# Patient Record
Sex: Male | Born: 1955 | Race: White | Hispanic: No | Smoking: Never smoker
Health system: Southern US, Community
[De-identification: ages and names within clinical notes are randomized; demographics above are authoritative.]

## PROBLEM LIST (undated history)

## (undated) DIAGNOSIS — Z95820 Peripheral vascular angioplasty status with implants and grafts: Secondary | ICD-10-CM

## (undated) DIAGNOSIS — M1990 Unspecified osteoarthritis, unspecified site: Secondary | ICD-10-CM

## (undated) DIAGNOSIS — J45909 Unspecified asthma, uncomplicated: Secondary | ICD-10-CM

## (undated) DIAGNOSIS — K219 Gastro-esophageal reflux disease without esophagitis: Secondary | ICD-10-CM

## (undated) DIAGNOSIS — C801 Malignant (primary) neoplasm, unspecified: Secondary | ICD-10-CM

## (undated) DIAGNOSIS — I209 Angina pectoris, unspecified: Secondary | ICD-10-CM

## (undated) DIAGNOSIS — J40 Bronchitis, not specified as acute or chronic: Secondary | ICD-10-CM

## (undated) DIAGNOSIS — E785 Hyperlipidemia, unspecified: Secondary | ICD-10-CM

## (undated) DIAGNOSIS — E119 Type 2 diabetes mellitus without complications: Secondary | ICD-10-CM

## (undated) DIAGNOSIS — I10 Essential (primary) hypertension: Secondary | ICD-10-CM

## (undated) DIAGNOSIS — R9439 Abnormal result of other cardiovascular function study: Secondary | ICD-10-CM

## (undated) DIAGNOSIS — K7460 Unspecified cirrhosis of liver: Secondary | ICD-10-CM

## (undated) DIAGNOSIS — Z006 Encounter for examination for normal comparison and control in clinical research program: Secondary | ICD-10-CM

## (undated) DIAGNOSIS — B1920 Unspecified viral hepatitis C without hepatic coma: Secondary | ICD-10-CM

## (undated) DIAGNOSIS — I2510 Atherosclerotic heart disease of native coronary artery without angina pectoris: Secondary | ICD-10-CM

## (undated) HISTORY — PX: TONSILLECTOMY: SUR1361

## (undated) HISTORY — PX: ROTATOR CUFF REPAIR: SHX139

## (undated) HISTORY — PX: JOINT REPLACEMENT: SHX530

## (undated) HISTORY — PX: ACROMIO-CLAVICULAR JOINT REPAIR: SHX5183

## (undated) HISTORY — PX: CHOLECYSTECTOMY: SHX55

## (undated) HISTORY — PX: COLONOSCOPY: SHX174

## (undated) HISTORY — DX: Type 2 diabetes mellitus without complications: E11.9

## (undated) HISTORY — PX: KNEE SURGERY: SHX244

## (undated) HISTORY — DX: Unspecified viral hepatitis C without hepatic coma: B19.20

## (undated) HISTORY — DX: Hyperlipidemia, unspecified: E78.5

## (undated) HISTORY — DX: Unspecified cirrhosis of liver: K74.60

---

## 2001-08-01 ENCOUNTER — Ambulatory Visit (HOSPITAL_COMMUNITY): Admission: RE | Admit: 2001-08-01 | Discharge: 2001-08-01 | Payer: Self-pay | Admitting: *Deleted

## 2001-10-31 ENCOUNTER — Encounter: Admission: RE | Admit: 2001-10-31 | Discharge: 2001-10-31 | Payer: Self-pay | Admitting: *Deleted

## 2002-02-08 ENCOUNTER — Encounter: Admission: RE | Admit: 2002-02-08 | Discharge: 2002-02-08 | Payer: Self-pay | Admitting: *Deleted

## 2002-07-10 ENCOUNTER — Encounter: Admission: RE | Admit: 2002-07-10 | Discharge: 2002-07-10 | Payer: Self-pay | Admitting: Psychiatry

## 2002-10-08 ENCOUNTER — Encounter: Admission: RE | Admit: 2002-10-08 | Discharge: 2002-10-08 | Payer: Self-pay | Admitting: Psychiatry

## 2003-02-01 ENCOUNTER — Encounter: Admission: RE | Admit: 2003-02-01 | Discharge: 2003-02-01 | Payer: Self-pay | Admitting: *Deleted

## 2003-03-04 ENCOUNTER — Encounter: Admission: RE | Admit: 2003-03-04 | Discharge: 2003-03-04 | Payer: Self-pay | Admitting: *Deleted

## 2003-05-06 ENCOUNTER — Encounter: Admission: RE | Admit: 2003-05-06 | Discharge: 2003-05-06 | Payer: Self-pay | Admitting: *Deleted

## 2003-05-19 ENCOUNTER — Encounter: Payer: Self-pay | Admitting: Emergency Medicine

## 2003-05-19 ENCOUNTER — Inpatient Hospital Stay (HOSPITAL_COMMUNITY): Admission: EM | Admit: 2003-05-19 | Discharge: 2003-05-21 | Payer: Self-pay | Admitting: Emergency Medicine

## 2003-08-30 ENCOUNTER — Encounter: Payer: Self-pay | Admitting: Internal Medicine

## 2003-08-30 ENCOUNTER — Encounter: Admission: RE | Admit: 2003-08-30 | Discharge: 2003-08-30 | Payer: Self-pay | Admitting: Internal Medicine

## 2003-09-05 ENCOUNTER — Encounter: Admission: RE | Admit: 2003-09-05 | Discharge: 2003-09-05 | Payer: Self-pay | Admitting: Internal Medicine

## 2003-09-05 ENCOUNTER — Encounter: Payer: Self-pay | Admitting: Internal Medicine

## 2003-11-19 ENCOUNTER — Emergency Department (HOSPITAL_COMMUNITY): Admission: AD | Admit: 2003-11-19 | Discharge: 2003-11-19 | Payer: Self-pay | Admitting: Family Medicine

## 2003-11-28 ENCOUNTER — Encounter (INDEPENDENT_AMBULATORY_CARE_PROVIDER_SITE_OTHER): Payer: Self-pay | Admitting: *Deleted

## 2003-11-28 ENCOUNTER — Ambulatory Visit (HOSPITAL_COMMUNITY): Admission: RE | Admit: 2003-11-28 | Discharge: 2003-11-29 | Payer: Self-pay | Admitting: Surgery

## 2003-12-21 HISTORY — PX: CARDIAC CATHETERIZATION: SHX172

## 2004-01-20 ENCOUNTER — Encounter: Admission: RE | Admit: 2004-01-20 | Discharge: 2004-01-20 | Payer: Self-pay | Admitting: Psychiatry

## 2005-01-20 ENCOUNTER — Ambulatory Visit (HOSPITAL_COMMUNITY): Payer: Self-pay | Admitting: Psychiatry

## 2005-07-20 ENCOUNTER — Ambulatory Visit: Payer: Self-pay | Admitting: Internal Medicine

## 2005-10-13 ENCOUNTER — Ambulatory Visit (HOSPITAL_COMMUNITY): Payer: Self-pay | Admitting: Psychiatry

## 2006-02-16 ENCOUNTER — Ambulatory Visit (HOSPITAL_COMMUNITY): Payer: Self-pay | Admitting: Psychiatry

## 2006-05-23 ENCOUNTER — Ambulatory Visit (HOSPITAL_COMMUNITY): Payer: Self-pay | Admitting: Psychiatry

## 2006-09-21 ENCOUNTER — Ambulatory Visit (HOSPITAL_COMMUNITY): Payer: Self-pay | Admitting: Psychiatry

## 2007-01-23 ENCOUNTER — Ambulatory Visit (HOSPITAL_COMMUNITY): Payer: Self-pay | Admitting: Psychiatry

## 2007-06-05 ENCOUNTER — Ambulatory Visit (HOSPITAL_COMMUNITY): Admission: RE | Admit: 2007-06-05 | Discharge: 2007-06-05 | Payer: Self-pay | Admitting: Orthopedic Surgery

## 2007-06-21 ENCOUNTER — Ambulatory Visit (HOSPITAL_COMMUNITY): Payer: Self-pay | Admitting: Psychiatry

## 2007-10-16 ENCOUNTER — Ambulatory Visit (HOSPITAL_COMMUNITY): Payer: Self-pay | Admitting: Psychiatry

## 2008-01-17 ENCOUNTER — Ambulatory Visit (HOSPITAL_COMMUNITY): Payer: Self-pay | Admitting: Psychiatry

## 2008-05-15 ENCOUNTER — Ambulatory Visit (HOSPITAL_COMMUNITY): Payer: Self-pay | Admitting: Psychiatry

## 2008-07-31 ENCOUNTER — Ambulatory Visit (HOSPITAL_COMMUNITY): Payer: Self-pay | Admitting: Psychiatry

## 2008-09-22 ENCOUNTER — Encounter: Admission: RE | Admit: 2008-09-22 | Discharge: 2008-09-22 | Payer: Self-pay | Admitting: Rheumatology

## 2008-11-11 ENCOUNTER — Ambulatory Visit (HOSPITAL_COMMUNITY): Payer: Self-pay | Admitting: Psychiatry

## 2009-02-03 ENCOUNTER — Ambulatory Visit (HOSPITAL_COMMUNITY): Payer: Self-pay | Admitting: Psychiatry

## 2009-04-30 ENCOUNTER — Ambulatory Visit (HOSPITAL_COMMUNITY): Payer: Self-pay | Admitting: Psychiatry

## 2009-06-01 ENCOUNTER — Emergency Department (HOSPITAL_COMMUNITY): Admission: EM | Admit: 2009-06-01 | Discharge: 2009-06-01 | Payer: Self-pay | Admitting: Family Medicine

## 2009-07-21 ENCOUNTER — Ambulatory Visit (HOSPITAL_COMMUNITY): Payer: Self-pay | Admitting: Psychiatry

## 2009-08-27 ENCOUNTER — Ambulatory Visit (HOSPITAL_BASED_OUTPATIENT_CLINIC_OR_DEPARTMENT_OTHER): Admission: RE | Admit: 2009-08-27 | Discharge: 2009-08-27 | Payer: Self-pay | Admitting: Orthopedic Surgery

## 2009-09-22 ENCOUNTER — Ambulatory Visit (HOSPITAL_COMMUNITY): Payer: Self-pay | Admitting: Psychiatry

## 2009-12-22 ENCOUNTER — Ambulatory Visit (HOSPITAL_COMMUNITY): Payer: Self-pay | Admitting: Psychiatry

## 2010-01-02 ENCOUNTER — Ambulatory Visit (HOSPITAL_COMMUNITY): Payer: Self-pay | Admitting: Psychiatry

## 2010-03-30 ENCOUNTER — Ambulatory Visit (HOSPITAL_COMMUNITY): Payer: Self-pay | Admitting: Psychiatry

## 2010-06-19 ENCOUNTER — Ambulatory Visit (HOSPITAL_COMMUNITY): Payer: Self-pay | Admitting: Psychiatry

## 2010-09-07 ENCOUNTER — Ambulatory Visit (HOSPITAL_COMMUNITY): Payer: Self-pay | Admitting: Psychiatry

## 2010-12-23 ENCOUNTER — Ambulatory Visit (HOSPITAL_COMMUNITY)
Admission: RE | Admit: 2010-12-23 | Discharge: 2010-12-23 | Payer: Self-pay | Source: Home / Self Care | Attending: Psychiatry | Admitting: Psychiatry

## 2011-03-26 LAB — GLUCOSE, CAPILLARY: Glucose-Capillary: 130 mg/dL — ABNORMAL HIGH (ref 70–99)

## 2011-03-26 LAB — POCT I-STAT 4, (NA,K, GLUC, HGB,HCT)
Glucose, Bld: 136 mg/dL — ABNORMAL HIGH (ref 70–99)
HCT: 47 % (ref 39.0–52.0)
Hemoglobin: 16 g/dL (ref 13.0–17.0)
Potassium: 4.1 mEq/L (ref 3.5–5.1)
Sodium: 140 mEq/L (ref 135–145)

## 2011-04-21 ENCOUNTER — Encounter (HOSPITAL_COMMUNITY): Payer: MEDICARE | Admitting: Psychiatry

## 2011-04-21 DIAGNOSIS — F3289 Other specified depressive episodes: Secondary | ICD-10-CM

## 2011-04-21 DIAGNOSIS — F329 Major depressive disorder, single episode, unspecified: Secondary | ICD-10-CM

## 2011-05-04 NOTE — Op Note (Signed)
NAMEWILDER, KUROWSKI              ACCOUNT NO.:  [id number]   MEDICAL RECORD NO.:  [id number]          PATIENT TYPE:  AMB   LOCATION:  DAY                          FACILITY:  [facility]   PHYSICIAN:  Ollen Gross, M.D.    DATE OF BIRTH:  08-24-1956   DATE OF PROCEDURE:  06/05/2007  DATE OF DISCHARGE:                               OPERATIVE REPORT   PREOPERATIVE DIAGNOSIS:  Left hip labral tear.   POSTOPERATIVE DIAGNOSIS:  Left hip labral tear, chondral defect  acetabulum.   PROCEDURE:  Left hip arthroscopy, labral debridement and chondroplasty.   SURGEON:  Ollen Gross, M.D.  No assistant.   ANESTHESIA:  General.   BLOOD LOSS:  Minimal.   DRAINS:  None.   COMPLICATIONS:  None.   CONDITION:  Stable to recovery.   BRIEF CLINICAL NOTE:  Levoy is a 55 year old male with a history of  progressively worsening left hip pain and mechanical symptoms.  Exam and  history suggested labral tear confirmed by MRI.  Presents now for  arthroscopy and debridement.   PROCEDURE IN DETAIL:  After successful initiation of general anesthetic,  the patient was placed in the right lateral decubitus position with left  side up.  Left lower extremity is placed over the well-padded perineal  traction post and left foot placed into the well-padded traction boot.  Under fluoroscopic guidance the left lower extremity was distracted, via  a longitudinal traction.  Once adequately distracted, we blocked the  traction in that position.  Thigh is then prepped and draped in the  usual sterile fashion.  Spinal needle was passed the anterior and  posterior peritrochanteric portals.  They both felt to pop into the  joint.  60 mL of saline solution was injected through the posterior  needle and outflow was confirmed through the anterior needle by  confirming both are intra-articular.  Nitinol wires passed.  We made a  small incision, created the posterior portal with the dilator and the  cannula.  The camera  passed into the joint posteriorly and arthroscopic  visualization proceeds.  The fovea looks normal.  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  Posteriorly and superiorly joint looks normal.  Anteriorly  there is an anterior inferior labral tear at about the 8 o'clock  position.  There is a small associated chondral defect with that.  The  anterior portal was found to be in good position and established the  anterior portal site with an incision and dilator and cannula.  The  labrum was is debrided back to stable base with the shaver and sealed  off with the ArthroCare device.  The chondral defect is debrided back to  stable bony base with stable cartilaginous edges.  I reinspected the  joint and the abnormal cartilage on the femoral head was debrided back  to stable cartilaginous base.  There is no exposed bone noted.  The  anterior portal was then removed once debridement was completed.  We  injected 20 mL of 0.25% Marcaine with epi into the inflow cannula  then that is removed.  Traction is removed off the joint.  Fluoroscopic  view confirms concentric reduction.  Incision is then closed with  interrupted 4-0 nylon and a bulky sterile dressing applied.  He is  awakened and transferred to recovery in stable condition.      Ollen Gross, M.D.  Electronically Signed     FA/MEDQ  D:  06/05/2007  T:  06/06/2007  Job:  [id number]
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
## 2011-05-07 NOTE — Op Note (Signed)
NAME:  Eddie Mullins, Eddie Mullins                        ACCOUNT NO.:  [id number]   MEDICAL RECORD NO.:  [id number]                   PATIENT TYPE:  OIB   LOCATION:  5727                                 FACILITY:  [facility]   PHYSICIAN:  Velora Heckler, M.D.                DATE OF BIRTH:  1956-06-07   DATE OF PROCEDURE:  11/28/2003  DATE OF DISCHARGE:                                 OPERATIVE REPORT   PREOPERATIVE DIAGNOSIS:  Biliary dyskinesia, gallbladder polyps, abdominal  pain.   POSTOPERATIVE DIAGNOSIS:  Biliary dyskinesia, gallbladder polyps, abdominal  pain, cirrhosis of the liver.   PROCEDURE:  Laparoscopic cholecystectomy with interoperative  cholangiography.   SURGEON:  Velora Heckler, M.D.   ASSISTANT:  Angelia Mould. Derrell Lolling, M.D.   ANESTHESIA:  General.   ESTIMATED BLOOD LOSS:  Minimal.   PREPARATION:  Betadine.   COMPLICATIONS:  None.   INDICATIONS FOR PROCEDURE:  The patient is a 55 year old white male who  presents at the request of Dr. Johnella Moloney with biliary dyskinesia and  gallbladder polyps.  The patient has had a several year history of  intermittent upper abdominal discomfort and intolerance of dairy products  and fatty foods.  The patient has a history of hepatitis C.  Gallbladder  ultrasound shows gallbladder polyps but no stones.  Gallbladder wall shows  changes consistent with adenomyomatosis.  Nuclear medicine scan showed an  ejection fraction of only 29%.  Administration of half and half orally  reproduced his biliary symptoms.  The patient now comes to surgery for  cholecystectomy.   DESCRIPTION OF PROCEDURE:  The procedure was done in OR 17 at [facility].  The patient was brought to the operating room and placed  in a supine position on the operating table.  Following administration of  general anesthesia, the patient was prepped and draped in the usual strict  aseptic fashion.  After ascertaining an adequate level of anesthesia had  been obtained, an infraumbilical incision was made with a #15 blade.  Dissection was carried down through the subcutaneous tissues and the fascia  was incised in the midline.  The peritoneal cavity was entered cautiously.  A 0 Vicryl purse-string suture was placed in the fascia.  A Hasson cannula  was introduced under direct vision and secured with a purse-string suture.  The abdomen was insufflated with carbon dioxide.  The laparoscope was  introduced under direct vision and the abdomen was explored.  There was  obvious mild to moderate cirrhosis of the liver.  The operative ports were  placed along the right costal margin in the midline, midclavicular line, and  the anterior axillary line.  The fundus of the gallbladder was identified  and retracted cephalad.  Adhesions to the gallbladder were taken down with  blunt dissection and hemostasis obtained with electrocautery.  The duodenum  is mobilized off the gallbladder.  The neck of the gallbladder is  exposed  and dissection is begun.  The peritoneum is incised at the neck of the  gallbladder.  A large lymph node is evident.  Small venous tributary is  evident and this is divided between [place] clips.  The cystic duct is  dissected out and a clip is placed at the neck of the gallbladder.  The clip  was incised.  Bile emanates from the cystic duct.  A stab wound is made in  the right upper quadrant and a Cook cholangiography catheter is introduced.  It is inserted into the cystic duct and secured with a Liga clip.  Using C-  arm fluoroscopy, real time cholangiography is performed.  There is rapid  filling of a normal caliber common bile duct.  There is free flow distally  into the duodenum without filling defect or obstruction.  There is reflux of  contrast into the right and left hepatic ductal systems.  The clip is  withdrawn, the catheter removed from the peritoneal cavity.  The cystic duct  is triply clipped and divided.  The cystic artery  was dissected out, doubly  clipped, and divided.  The gallbladder was then excised from the gallbladder  bed using hook and spatula electrocautery for hemostasis.  The gallbladder  was completely excised and placed into an endocatch bag.  It is withdrawn  through the umbilical port without difficulty.  The right upper quadrant was  irrigated with warm saline which is evacuated.  Good hemostasis is noted.  The ports were removed and pneumoperitoneum was released.  The 0 Vicryl  purse-string suture is tied securely.  The port sites were anesthetized with  local anesthetic.  All wounds were closed with interrupted 4-0 Vicryl  subcuticular sutures.  The wounds were washed and dried and Benzoin and  Steri-Strips were applied.  Sterile gauze dressings are applied.  The  patient was awakened from anesthesia and brought to the recovery room in  stable condition.  The patient tolerated the procedure well.                                               Velora Heckler, M.D.    TMG/MEDQ  D:  11/28/2003  T:  11/28/2003  Job:  [id number]   cc:   Candyce Churn, M.D.  [address] [place]  [place] [address]  Fax: [phone]   Colleen Can. Deborah Chalk, M.D.  Fax: [phone]   Dr. Johny Sax  [facility]  Section of Gastroenterology
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
## 2011-05-07 NOTE — Discharge Summary (Signed)
NAME:  RAYMON, SCHLARB                        ACCOUNT NO.:  [id number]   MEDICAL RECORD NO.:  [id number]                   PATIENT TYPE:  INP   LOCATION:  3702                                 FACILITY:  [facility]   PHYSICIAN:  Candyce Churn, M.D.          DATE OF BIRTH:  Feb 03, 1956   DATE OF ADMISSION:  05/19/2003  DATE OF DISCHARGE:  05/21/2003                                 DISCHARGE SUMMARY   DISCHARGE DIAGNOSES:  1. Chest pain syndrome, possibly vasospasm.  2. Attention deficit disorder and depression versus bipolar disorder.  3. Hypertension.  4. Migraine headaches.  5. Chronic active hepatitis C.  6. Gastroesophageal reflux disease.  7. Degenerative joint disease of the lumbosacral spine.  8. Degenerative arthritis of the knees with arthroscopic knee surgery four     days prior to admission.   DISCHARGE MEDICATIONS:  1. Plavix 75 mg daily for four weeks.  2. Aspirin 81 mg daily.  3. Lopressor 25 mg b.i.d. for four weeks.  4. Prilosec 20 mg daily.  5. Hydrochlorothiazide 12.5 mg daily.  6. Prozac 20 mg daily.   Imitrex and Ritalin used prior to the admission should be held for now.   CONSULTATIONS:  Cardiology, Dr. Colleen Can. [place].   PROCEDURES:  Cardiac catheterization, May 20, 2003 -- left ventricle within  normal limits, left main coronary artery normal, left anterior descending  has a 20% distal block after the first septal perforator, left circumflex  normal, right coronary artery normal.   (Chest pain syndrome felt likely to be secondary to coronary spasm.)   DISCHARGE LABORATORY DATA:  May 19, 2003 -- white count 5400, hemoglobin  14.2, platelet count 182,000, normal differential.  PT 12.2 seconds, PTT 30  seconds.  Electrolytes on May 20, 2003:  Sodium 137, potassium 4.7, chloride  103, bicarb 29, glucose 132, BUN 16, creatinine 1.2, calcium 9.2.  CPKs  drawn on May 19, 2003 and May 20, 2003 were 431 and 355 and 242 in 8-hour  sequences.  CK-MB was  7.6, 6.3 and 4.3, respectively.  Relative index was  1.8, 1.8 and 2.0, respectively -- within normal limits.   Troponin on admission was 0.03 but on second cardiac enzyme draw on May 20, 2003, it was 0.20 -- elevated.   Cholesterol was 136, drawn on May 20, 2003, triglycerides 113, HDL 66, total  cholesterol to HDL ratio was 2.1, LDL was low at 47.   HOSPITAL COURSE:  The patient is a very pleasant 55 year old male who  presented on May 19, 2003 complaining of exercise-induced chest pain over  several weeks, with a very severe prolonged episode after the use of  Imitrex, which he takes for migraine headaches on the day of admission.  The  patient was sitting at rest watching TV when a migraine started and he self-  medicated with subcu Imitrex for a migraine headaches -- ? 6 mg -- and he  subsequently  had nausea, diaphoresis and shortness of breath and severe  anterior chest pressure lasting 5 to 10 minutes.  It seemed to radiate into  his left arm.  He also felt as if he were going to have diarrhea.  Pain  lasted longer than 15 minutes, then resolved spontaneously.  He had four or  five episodes of less severe but similar pain with extremes of his migraines  over the previous two to three weeks prior to admission.  He abdomen was  soft admitted by Dr. Marcene Duos on May 19, 2003.   He was seen in consultation by Dr. Colleen Can. Deborah Chalk and received a cardiac  catheterization, which was with results as above.   It was felt that he likely had some coronary vasospasm secondary to the  Imitrex causing his symptoms.   The patient was discharged home on May 21, 2003 to remain off triptans and  Ritalin for now and he was to be seen in the office in one week.   He was to take the aspirin, Plavix and Lopressor in four to six weeks,  status post his cardiac catheterization.   The patient was discharged home in improved condition without chest pain.                                                Candyce Churn, M.D.    RNG/MEDQ  D:  06/09/2003  T:  06/10/2003  Job:  [id number]   cc:   Colleen Can. Deborah Chalk, M.D.  [address], [address]  [place]  [place] [address]  Fax: [phone]    cc:   Colleen Can. Deborah Chalk, M.D.  [address], [address]  [place]  [place] [address]  Fax: [phone]

## 2011-05-07 NOTE — Cardiovascular Report (Signed)
   NAME:  MAC, DOWDELL                        ACCOUNT NO.:  [id number]   MEDICAL RECORD NO.:  [id number]                   PATIENT TYPE:  INP   LOCATION:  3702                                 FACILITY:  [facility]   PHYSICIAN:  Colleen Can. Deborah Chalk, M.D.            DATE OF BIRTH:  12-12-1956   DATE OF PROCEDURE:  05/20/2003  DATE OF DISCHARGE:                              CARDIAC CATHETERIZATION   PROCEDURE:  Left heart catheterization with selective coronary angiography,  left ventricular angiography.   CARDIOLOGIST:  Colleen Can. Deborah Chalk, M.D.   INDICATIONS:  The patient is a 55 year old gentleman with a severe episode  of chest pain following Imitrex injection.  He had positive cardiac enzymes.  He is referred for evaluation.   TYPE AND SITE OF ENTRY:  Percutaneous right femoral artery.   CATHETERS:  A 6 French 4 curved Judkins right and left coronary catheters, 6  French pigtail ventriculographic catheter, Perclose.   MEDICATIONS GIVEN PRIOR TO THE PROCEDURE:  Valium 10 mg p.o.   MEDICATIONS GIVEN DURING THE PROCEDURE:  Versed 2 mg IV.   COMMENTS:  The patient tolerated the procedure well.   HEMODYNAMIC DATA:  The aortic pressure was 117/75, LV is 123/10-17.  There  was no aortic valve gradient noted on pullback.   ANGIOGRAPHIC DATA:  1. The left main coronary artery was normal.  2. Left anterior descending has a 20-30% narrowing after the first septal     perforating branch.  It is relatively smooth in contour.  3. The left circumflex has a high obtuse marginal and a large posterior     lateral branch.  It is normal.  4. Right coronary artery is a dominant vessel.  It is normal.   LEFT VENTRICULAR ANGIOGRAM:  The left ventricular angiogram was performed in  the RAO position.  The overall cardiac size and silhouette were normal.  The  global ejection fraction was 60%.  Regional wall motion was normal.   OVERALL IMPRESSION:  1. Mild coronary atherosclerosis in the mid left  anterior descending with no     residual coronary atherosclerosis otherwise.  2. Normal left ventricular function.    DISCUSSION:  In light of these findings, there is no significant obstructive  disease, and the patient will need to be treated and managed for vasospastic  disease in light of the elevated CK-MBs and troponins and symptomatic event.                                               Colleen Can. Deborah Chalk, M.D.    SNT/MEDQ  D:  05/20/2003  T:  05/20/2003  Job:  [id number]
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
## 2011-07-23 ENCOUNTER — Encounter (HOSPITAL_COMMUNITY): Payer: Medicare Other | Admitting: Psychiatry

## 2011-07-23 DIAGNOSIS — F988 Other specified behavioral and emotional disorders with onset usually occurring in childhood and adolescence: Secondary | ICD-10-CM

## 2011-07-23 DIAGNOSIS — F3289 Other specified depressive episodes: Secondary | ICD-10-CM

## 2011-07-23 DIAGNOSIS — F329 Major depressive disorder, single episode, unspecified: Secondary | ICD-10-CM

## 2011-08-26 ENCOUNTER — Other Ambulatory Visit: Payer: Self-pay | Admitting: Gastroenterology

## 2011-08-26 ENCOUNTER — Ambulatory Visit (HOSPITAL_COMMUNITY)
Admission: RE | Admit: 2011-08-26 | Discharge: 2011-08-26 | Disposition: A | Discharge status: Home / Self Care | Payer: Medicare Other | Source: Ambulatory Visit | Attending: Gastroenterology | Admitting: Gastroenterology

## 2011-08-26 DIAGNOSIS — I10 Essential (primary) hypertension: Secondary | ICD-10-CM | POA: Insufficient documentation

## 2011-08-26 DIAGNOSIS — Z1211 Encounter for screening for malignant neoplasm of colon: Secondary | ICD-10-CM | POA: Insufficient documentation

## 2011-08-26 DIAGNOSIS — E785 Hyperlipidemia, unspecified: Secondary | ICD-10-CM | POA: Insufficient documentation

## 2011-08-26 DIAGNOSIS — Z79899 Other long term (current) drug therapy: Secondary | ICD-10-CM | POA: Insufficient documentation

## 2011-08-26 DIAGNOSIS — K219 Gastro-esophageal reflux disease without esophagitis: Secondary | ICD-10-CM | POA: Insufficient documentation

## 2011-08-26 DIAGNOSIS — K621 Rectal polyp: Secondary | ICD-10-CM | POA: Insufficient documentation

## 2011-08-26 DIAGNOSIS — N400 Enlarged prostate without lower urinary tract symptoms: Secondary | ICD-10-CM | POA: Insufficient documentation

## 2011-08-26 DIAGNOSIS — K620 Anal polyp: Secondary | ICD-10-CM | POA: Insufficient documentation

## 2011-08-26 DIAGNOSIS — E119 Type 2 diabetes mellitus without complications: Secondary | ICD-10-CM | POA: Insufficient documentation

## 2011-08-26 LAB — GLUCOSE, CAPILLARY: Glucose-Capillary: 164 mg/dL — ABNORMAL HIGH (ref 70–99)

## 2011-08-29 NOTE — Op Note (Signed)
  NAME:  Eddie Mullins, TINNELL NO.:  [id number]  MEDICAL RECORD NO.:  [id number]  LOCATION:  [facility]                         FACILITY:  [facility]  PHYSICIAN:  Danise Edge, M.D.   DATE OF BIRTH:  1956-01-12  DATE OF PROCEDURE:  08/26/2011 DATE OF DISCHARGE:                              OPERATIVE REPORT   Baseline screening colonoscopy report.  REFERRING PHYSICIAN:  Pearla Dubonnet, M.D.  HISTORY:  Mr. Irving Bloor is a 55 year old male, born on [birth year].  The patient is scheduled to undergo his first screening colonoscopy with polypectomy to prevent colon cancer.  ENDOSCOPIST:  Danise Edge, M.D.  PREMEDICATION:  Fentanyl 100 mcg, Versed 10 mg.  PROCEDURE IN DETAIL:  The patient was placed in the left lateral decubitus position.  Anal inspection and digital rectal exam were normal.  The Pentax pediatric colonoscope was introduced into the rectum and easily advanced to the cecum.  A normal-appearing ileocecal valve and appendiceal orifice were identified.  Colonic preparation for the exam today was good.  Rectum:  A 4 mm polyp was removed from the mid rectum with the cold biopsy forceps and submitted for pathological interpretation.  Retroflex view of the distal rectum was normal.  Sigmoid colon and descending colon normal.  Splenic flexure normal.  Transverse colon normal.  Hepatic flexure normal.  Ascending colon normal.  Cecum and ileocecal valve normal.  ASSESSMENT:  From the mid rectum, a 4 mm sessile polyp was removed with a cold biopsy forceps, otherwise normal screening proctocolonoscopy of the cecum.  RECOMMENDATIONS:  If the rectal polyp returns neoplastic pathologically, the patient should undergo a surveillance colonoscopy in 5 years.  If the rectal polyp returns non-neoplastic pathologically, he should undergo a repeat screening colonoscopy in 10 years.          ______________________________ Danise Edge,  M.D.     MJ/MEDQ  D:  08/26/2011  T:  08/26/2011  Job:  [id number]  cc:   Pearla Dubonnet, M.D. Fax: [phone]  Electronically Signed by Danise Edge M.D. on 08/29/2011 09:13:50 AM

## 2011-10-07 LAB — BASIC METABOLIC PANEL
BUN: 13
CO2: 33 — ABNORMAL HIGH
Calcium: 9.9
Chloride: 101
Creatinine, Ser: 1.33
GFR calc Af Amer: >60
GFR calc non Af Amer: 56 — ABNORMAL LOW
Glucose, Bld: 288 — ABNORMAL HIGH
Potassium: 5
Sodium: 141

## 2011-10-07 LAB — HEMOGLOBIN AND HEMATOCRIT, BLOOD
HCT: 46.5
Hemoglobin: 16

## 2011-10-07 LAB — APTT: aPTT: 26

## 2011-10-07 LAB — HEPATIC FUNCTION PANEL
ALT: 131 — ABNORMAL HIGH
AST: 93 — ABNORMAL HIGH
Albumin: 4
Alkaline Phosphatase: 101
Bilirubin, Direct: 0.3
Indirect Bilirubin: 1.2 — ABNORMAL HIGH
Total Bilirubin: 1.5 — ABNORMAL HIGH
Total Protein: 6.7

## 2011-10-07 LAB — PROTIME-INR
INR: 1
Prothrombin Time: 13.1

## 2011-10-18 ENCOUNTER — Encounter (HOSPITAL_COMMUNITY): Payer: Medicare Other | Admitting: Psychiatry

## 2011-10-18 DIAGNOSIS — F988 Other specified behavioral and emotional disorders with onset usually occurring in childhood and adolescence: Secondary | ICD-10-CM

## 2011-10-18 DIAGNOSIS — F329 Major depressive disorder, single episode, unspecified: Secondary | ICD-10-CM

## 2012-01-10 ENCOUNTER — Ambulatory Visit (INDEPENDENT_AMBULATORY_CARE_PROVIDER_SITE_OTHER): Payer: Medicare Other | Admitting: Psychiatry

## 2012-01-10 ENCOUNTER — Encounter (HOSPITAL_COMMUNITY): Payer: Self-pay | Admitting: Psychiatry

## 2012-01-10 VITALS — Wt 208.0 lb

## 2012-01-10 DIAGNOSIS — F909 Attention-deficit hyperactivity disorder, unspecified type: Secondary | ICD-10-CM

## 2012-01-10 DIAGNOSIS — F329 Major depressive disorder, single episode, unspecified: Secondary | ICD-10-CM

## 2012-01-10 MED ORDER — METHYLPHENIDATE HCL 20 MG PO TABS: 20.0000 mg | ORAL_TABLET | Freq: Every day | ORAL | Status: DC

## 2012-01-10 NOTE — Progress Notes (Signed)
Patient came for his followup appointment. He has recently seen his primary care physician and has blood work done. Patient brought his blood results which shows hemoglobin A1c 7.9 along with AST 75 he'll T1 27 creatinine 1.14 BUN 26. He has a normal WBC count and bilirubin. He admitted that he has been noncompliant with his exercise and had stopped going to change and neglected his diet. He has started exercise and now taking medication to control his blood sugar and hypertension. Patient do not remember the new medication which was started recently as he is getting samples from her primary care physician and will likely go back in 2 weeks to see the response. Patient reported that Ritalin is helping his focus concentration and energy. His been going today school but also some distress at school. He denies any agitation anger or mood swings. When he takes his Ritalin his depression and focuses much improved. He is not using any drugs or drinking alcohol. He is sleeping well and denies any agitation anger or mood swings.  Mental status examination Patient is casually dressed and fairly groomed. His speech is somewhat fast but relevant. He denies any auditory or visual hallucination. He denies any active or passive suicidal thoughts. His attention and concentration is fair. His thought process is logical linear and goal-directed. He's alert oriented x3. His insight judgment and pulse control is okay  Assessment Depressive disorder NOS, attention deficit disorder  Plan I will continue his Ritalin 10 mg daily however I reinforce exercise and healthy diet to better control of his blood pressure and blood sugar. I recommended to bring although list of medication along with blood work results. I recommended to call us if he is any question or concern about the medication or if he feel worsening of the symptoms. I will see him again in 2 months

## 2012-03-06 ENCOUNTER — Ambulatory Visit (HOSPITAL_COMMUNITY): Payer: Medicare Other | Admitting: Psychiatry

## 2012-09-20 ENCOUNTER — Encounter (HOSPITAL_COMMUNITY): Payer: Self-pay | Admitting: *Deleted

## 2012-09-20 ENCOUNTER — Emergency Department (HOSPITAL_COMMUNITY)
Admission: EM | Admit: 2012-09-20 | Discharge: 2012-09-20 | Disposition: A | Discharge status: Home / Self Care | Payer: Medicare Other | Attending: Emergency Medicine | Admitting: Emergency Medicine

## 2012-09-20 ENCOUNTER — Emergency Department (HOSPITAL_COMMUNITY): Payer: Medicare Other

## 2012-09-20 DIAGNOSIS — E119 Type 2 diabetes mellitus without complications: Secondary | ICD-10-CM | POA: Insufficient documentation

## 2012-09-20 DIAGNOSIS — Z9089 Acquired absence of other organs: Secondary | ICD-10-CM | POA: Insufficient documentation

## 2012-09-20 DIAGNOSIS — S161XXA Strain of muscle, fascia and tendon at neck level, initial encounter: Secondary | ICD-10-CM

## 2012-09-20 DIAGNOSIS — Z79899 Other long term (current) drug therapy: Secondary | ICD-10-CM | POA: Insufficient documentation

## 2012-09-20 DIAGNOSIS — S060XAA Concussion with loss of consciousness status unknown, initial encounter: Secondary | ICD-10-CM

## 2012-09-20 DIAGNOSIS — S4990XA Unspecified injury of shoulder and upper arm, unspecified arm, initial encounter: Secondary | ICD-10-CM

## 2012-09-20 DIAGNOSIS — M542 Cervicalgia: Secondary | ICD-10-CM | POA: Insufficient documentation

## 2012-09-20 DIAGNOSIS — I10 Essential (primary) hypertension: Secondary | ICD-10-CM | POA: Insufficient documentation

## 2012-09-20 DIAGNOSIS — S060X9A Concussion with loss of consciousness of unspecified duration, initial encounter: Secondary | ICD-10-CM

## 2012-09-20 DIAGNOSIS — S139XXA Sprain of joints and ligaments of unspecified parts of neck, initial encounter: Secondary | ICD-10-CM | POA: Insufficient documentation

## 2012-09-20 DIAGNOSIS — Z7982 Long term (current) use of aspirin: Secondary | ICD-10-CM | POA: Insufficient documentation

## 2012-09-20 DIAGNOSIS — S4980XA Other specified injuries of shoulder and upper arm, unspecified arm, initial encounter: Secondary | ICD-10-CM | POA: Insufficient documentation

## 2012-09-20 DIAGNOSIS — S46909A Unspecified injury of unspecified muscle, fascia and tendon at shoulder and upper arm level, unspecified arm, initial encounter: Secondary | ICD-10-CM | POA: Insufficient documentation

## 2012-09-20 HISTORY — DX: Essential (primary) hypertension: I10

## 2012-09-20 MED ORDER — OXYCODONE-ACETAMINOPHEN 5-325 MG PO TABS: 1.0000 | ORAL_TABLET | Freq: Four times a day (QID) | ORAL | Status: DC | PRN

## 2012-09-20 MED ORDER — OXYCODONE-ACETAMINOPHEN 5-325 MG PO TABS
1.0000 | ORAL_TABLET | Freq: Once | ORAL | Status: AC
  Administered 2012-09-20: 1 via ORAL
  Filled 2012-09-20: qty 1

## 2012-09-20 MED ORDER — CYCLOBENZAPRINE HCL 10 MG PO TABS: 10.0000 mg | ORAL_TABLET | Freq: Two times a day (BID) | ORAL | Status: DC | PRN

## 2012-09-20 NOTE — ED Notes (Signed)
Ortho tech notified of need for sling placement.
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
## 2012-09-20 NOTE — ED Provider Notes (Signed)
Medical screening examination/treatment/procedure(s) were performed by non-physician practitioner and as supervising physician I was immediately available for consultation/collaboration.   Gwyneth Sprout, MD 09/20/12 2324

## 2012-09-20 NOTE — ED Notes (Addendum)
Error in charting.
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
## 2013-02-28 ENCOUNTER — Ambulatory Visit (INDEPENDENT_AMBULATORY_CARE_PROVIDER_SITE_OTHER): Payer: Medicare Other | Admitting: Surgery

## 2013-03-12 ENCOUNTER — Ambulatory Visit (INDEPENDENT_AMBULATORY_CARE_PROVIDER_SITE_OTHER): Payer: Medicare Other | Admitting: Surgery

## 2013-03-12 ENCOUNTER — Encounter (INDEPENDENT_AMBULATORY_CARE_PROVIDER_SITE_OTHER): Payer: Self-pay | Admitting: Surgery

## 2013-03-12 VITALS — BP 132/84 | HR 85 | Temp 98.1°F | Ht 71.0 in | Wt 207.6 lb

## 2013-03-12 DIAGNOSIS — K648 Other hemorrhoids: Secondary | ICD-10-CM

## 2013-03-12 MED ORDER — HYDROCORTISONE ACETATE 25 MG RE SUPP: 25.0000 mg | Freq: Two times a day (BID) | RECTAL | Status: DC

## 2013-03-12 NOTE — Patient Instructions (Signed)
Hemorrhoid Banding Hemorrhoids are veins in the anus and lower rectum that become enlarged. The most common symptoms are rectal bleeding, itching, and sometimes pain. Hemorrhoids might come out with straining or having a bowel movement, and they can sometimes be pushed back in. There are internal and external hemorrhoids. Only internal hemorrhoids can be treated with banding. In this procedure, a rubber band is placed near the hemorrhoid tissue, cutting off the blood supply. This procedure prevents the hemorrhoids from slipping down. LET YOUR CAREGIVER KNOW ABOUT: All medicines you are taking, especially blood thinners such as aspirin and coumadin.  RISKS AND COMPLICATIONS This is not a painful procedure, but if you do have intense pain immediately let your surgeon know because the band may need to be removed. You may have some mild pain or discomfort in the first 2 days or so after treatment. Sometimes there may be delayed bleeding in the first week after treatment.  BEFORE THE PROCEDURE  There is no special preparation needed before banding. Your surgeon may have you do an enema prior to the procedure. You will go home the same day.  HOME CARE INSTRUCTIONS   Your surgeon might instruct you to do sitz baths as needed if you have discomfort or after a bowel movement.  You may be instructed to use fiber supplements. SEEK MEDICAL CARE IF:  You have an increase in pain.  Your pain does not get better. SEEK IMMEDIATE MEDICAL CARE IF:  You have intense pain.  Fever greater than 100.5 F (38.1 C).  Bleeding that does not stop, or pus from the anus. Document Released: 10/03/2009 Document Revised: 02/28/2012 Document Reviewed: 10/03/2009 Exit[employer] Patient Information 2013 [employer].  

## 2013-03-12 NOTE — Progress Notes (Signed)
General Surgery [facility]  Chief Complaint  Patient presents with  . New Evaluation    evaluate hemorrhoids - referral from Dr. Robley Fries    HISTORY: Patient is a 57 year old white male known to my practice for cholecystectomy in 2005. Patient has had intermittent problems with bleeding from internal hemorrhoids. Patient does have hepatitis C but does not have documented cirrhosis. He has never had any problems with variceal bleeding.  Over the past several months the patient has experienced bleeding with bowel movements. He also has noticed some bleeding with physical activity and exertion. He is using stool softeners. He has use Preparation H in the past but is not having good results at this time. Patient was seen by his primary care physician and underwent anoscopy. He is referred today for hemorrhoid banding.  Past Medical History  Diagnosis Date  . Diabetes mellitus type II   . Hepatitis C   . Hypertension      Current Outpatient Prescriptions  Medication Sig Dispense Refill  . amLODipine (NORVASC) 5 MG tablet       . aspirin 81 MG tablet Take 160 mg by mouth daily.      . cycloSPORINE (RESTASIS) 0.05 % ophthalmic emulsion Place 1 drop into both eyes 2 (two) times daily.      Marland Kitchen doxycycline (VIBRAMYCIN) 100 MG capsule       . glucosamine-chondroitin 500-400 MG tablet Take 1 tablet by mouth 3 (three) times daily.      Boris Lown Oil (HM MEGAKRILL PO) Take 500 mg by mouth daily.      Marland Kitchen linagliptin (TRADJENTA) 5 MG TABS tablet Take 5 mg by mouth daily.      Marland Kitchen losartan-hydrochlorothiazide (HYZAAR) 100-25 MG per tablet       . metFORMIN (GLUMETZA) 500 MG (MOD) 24 hr tablet Take 500 mg by mouth 2 (two) times daily with a meal.      . methylphenidate (RITALIN) 20 MG tablet Take 20 mg by mouth daily.      . milk thistle 175 MG tablet Take 175 mg by mouth daily.      . Multiple Vitamins-Minerals (MULTIVITAMIN WITH MINERALS) tablet Take 1 tablet by mouth daily.       Marland Kitchen omeprazole (PRILOSEC) 20 MG capsule Take 20 mg by mouth daily.      Marland Kitchen RESVERATROL 100 MG CAPS Take 100 mg by mouth every 6 (six) hours.      . Selenium 200 MCG CAPS Take 200 mcg by mouth daily.      . hydrocortisone (ANUSOL-HC) 25 MG suppository Place 1 suppository (25 mg total) rectally 2 (two) times daily.  24 suppository  1   No current facility-administered medications for this visit.     No Known Allergies   Family History  Problem Relation Age of Onset  . Hypertension Mother      History   Social History  . Marital Status: Divorced    Spouse Name: N/A    Number of Children: N/A  . Years of Education: N/A   Social History Main Topics  . Smoking status: Former Games developer  . Smokeless tobacco: Former Neurosurgeon    Quit date: 03/13/1999  . Alcohol Use: Yes  . Drug Use: No  . Sexually Active: None   Other Topics Concern  . None   Social History Narrative  . None     REVIEW OF SYSTEMS - PERTINENT POSITIVES ONLY: Intermittent bleeding, both with bowel movements and during physical exertion.  No prior rectal surgery. Prior pilonidal cystectomy.  EXAM: Filed Vitals:   03/12/13 1421  BP: 132/84  Pulse: 85  Temp: 98.1 F (36.7 C)    HEENT: normocephalic; pupils equal and reactive; sclerae clear; dentition good; mucous membranes moist NECK:  symmetric on extension; no palpable anterior or posterior cervical lymphadenopathy; no supraclavicular masses; no tenderness CHEST: clear to auscultation bilaterally without rales, rhonchi, or wheezes CARDIAC: regular rate and rhythm without significant murmur; peripheral pulses are full Rectal:  External examination shows relatively normal anoderm. Eversion shows no sign of fissure or fistula.  Digital rectal exam shows slightly lax anal tone without palpable mass. Anoscopy is performed and shows grade 2-3 internal hemorrhoids with slight prolapse. The most prominent column is the right anterior with superficial ulceration. There is  no active bleeding. Rubber band ligation is performed at 2 sites in the right anterior column without undue discomfort. EXT:  non-tender without edema; no deformity NEURO: no gross focal deficits; no sign of tremor   LABORATORY RESULTS: See [facility] ([facility]) for most recent results   RADIOLOGY RESULTS: See [facility] ([facility]pic) for most recent results   IMPRESSION: Grade 2-3 internal hemorrhoids with recent history of bleeding and mild prolapse  PLAN: I provided the patient with written literature to review on hemorrhoids and there management. I explained the procedure of rubber band ligation and the expected outcome. The patient will also try a course of Anusol HC suppositories for 2 weeks.  If there is not significant symptomatic improvement, I have asked the patient to return to see one of my partners, Dr. Chevis Pretty, regarding the possibility of a stapled PPH procedure.  Velora Heckler, MD, FACS General & Endocrine Surgery [facility]   Visit Diagnoses: 1. Hemorrhoids, internal, with bleeding     Primary Care Physician: Pearla Dubonnet, MD

## 2013-03-13 ENCOUNTER — Telehealth (INDEPENDENT_AMBULATORY_CARE_PROVIDER_SITE_OTHER): Payer: Self-pay | Admitting: *Deleted

## 2013-03-13 NOTE — Telephone Encounter (Signed)
Patient called to request we call to try to get approval of the Anusol with his insurance company.  This RN has called and submitted a claim to have it reviewed and hopefully approved by insurance company.  They state that this RN will receive a fax within the next 72 hours with either an approval or denial.  Patient updated at this time and agreeable.

## 2013-03-15 NOTE — Telephone Encounter (Signed)
Received a denial to cover the medication from the insurance company today.  Patient updated on their decision.  Patient states he already went last night and picked up the prescription and just paid out of pocket for it.  Patient states it is helping a lot.  Patient states when he runs out he may call back to try to find out if there is a substitute for the medication that is covered.

## 2013-04-18 ENCOUNTER — Other Ambulatory Visit: Payer: Self-pay | Admitting: Dermatology

## 2013-07-12 ENCOUNTER — Other Ambulatory Visit: Payer: Self-pay | Admitting: Internal Medicine

## 2013-07-12 DIAGNOSIS — B182 Chronic viral hepatitis C: Secondary | ICD-10-CM

## 2013-07-18 ENCOUNTER — Other Ambulatory Visit: Payer: Medicare Other

## 2013-07-19 ENCOUNTER — Ambulatory Visit
Admission: RE | Admit: 2013-07-19 | Discharge: 2013-07-19 | Disposition: A | Discharge status: Home / Self Care | Payer: Medicare Other | Source: Ambulatory Visit | Attending: Internal Medicine | Admitting: Internal Medicine

## 2013-07-19 DIAGNOSIS — B182 Chronic viral hepatitis C: Secondary | ICD-10-CM

## 2013-08-01 ENCOUNTER — Other Ambulatory Visit: Payer: Self-pay | Admitting: Internal Medicine

## 2013-08-01 DIAGNOSIS — R7989 Other specified abnormal findings of blood chemistry: Secondary | ICD-10-CM

## 2013-08-01 DIAGNOSIS — B182 Chronic viral hepatitis C: Secondary | ICD-10-CM

## 2013-08-02 ENCOUNTER — Ambulatory Visit
Admission: RE | Admit: 2013-08-02 | Discharge: 2013-08-02 | Disposition: A | Discharge status: Home / Self Care | Payer: Medicare Other | Source: Ambulatory Visit | Attending: Internal Medicine | Admitting: Internal Medicine

## 2013-08-02 DIAGNOSIS — R7989 Other specified abnormal findings of blood chemistry: Secondary | ICD-10-CM

## 2013-08-02 DIAGNOSIS — B182 Chronic viral hepatitis C: Secondary | ICD-10-CM

## 2013-08-02 MED ORDER — IOHEXOL 300 MG/ML  SOLN
100.0000 mL | Freq: Once | INTRAMUSCULAR | Status: AC | PRN
  Administered 2013-08-02: 100 mL via INTRAVENOUS

## 2013-10-25 ENCOUNTER — Other Ambulatory Visit: Payer: Self-pay

## 2014-04-15 ENCOUNTER — Other Ambulatory Visit (HOSPITAL_COMMUNITY): Payer: Self-pay | Admitting: Internal Medicine

## 2014-04-15 DIAGNOSIS — R1011 Right upper quadrant pain: Secondary | ICD-10-CM

## 2014-04-19 ENCOUNTER — Ambulatory Visit (HOSPITAL_COMMUNITY)
Admission: RE | Admit: 2014-04-19 | Discharge: 2014-04-19 | Disposition: A | Discharge status: Home / Self Care | Payer: 59 | Source: Ambulatory Visit | Attending: Internal Medicine | Admitting: Internal Medicine

## 2014-04-19 DIAGNOSIS — R1011 Right upper quadrant pain: Secondary | ICD-10-CM

## 2014-04-19 DIAGNOSIS — B1920 Unspecified viral hepatitis C without hepatic coma: Secondary | ICD-10-CM | POA: Insufficient documentation

## 2014-04-19 DIAGNOSIS — R109 Unspecified abdominal pain: Secondary | ICD-10-CM | POA: Insufficient documentation

## 2014-04-30 ENCOUNTER — Other Ambulatory Visit (HOSPITAL_COMMUNITY): Payer: Self-pay | Admitting: Nurse Practitioner

## 2014-04-30 DIAGNOSIS — K7460 Unspecified cirrhosis of liver: Secondary | ICD-10-CM

## 2014-04-30 DIAGNOSIS — R772 Abnormality of alphafetoprotein: Secondary | ICD-10-CM

## 2014-05-10 ENCOUNTER — Ambulatory Visit (HOSPITAL_COMMUNITY)
Admission: RE | Admit: 2014-05-10 | Discharge: 2014-05-10 | Disposition: A | Discharge status: Home / Self Care | Payer: 59 | Source: Ambulatory Visit | Attending: Nurse Practitioner | Admitting: Nurse Practitioner

## 2014-05-10 ENCOUNTER — Other Ambulatory Visit (HOSPITAL_COMMUNITY): Payer: Self-pay | Admitting: Nurse Practitioner

## 2014-05-10 ENCOUNTER — Encounter (HOSPITAL_COMMUNITY): Payer: Self-pay

## 2014-05-10 DIAGNOSIS — K7460 Unspecified cirrhosis of liver: Secondary | ICD-10-CM

## 2014-05-10 DIAGNOSIS — R772 Abnormality of alphafetoprotein: Secondary | ICD-10-CM

## 2014-05-10 DIAGNOSIS — K5730 Diverticulosis of large intestine without perforation or abscess without bleeding: Secondary | ICD-10-CM | POA: Insufficient documentation

## 2014-05-10 DIAGNOSIS — N281 Cyst of kidney, acquired: Secondary | ICD-10-CM | POA: Insufficient documentation

## 2014-05-10 DIAGNOSIS — R160 Hepatomegaly, not elsewhere classified: Secondary | ICD-10-CM | POA: Insufficient documentation

## 2014-05-10 DIAGNOSIS — B1920 Unspecified viral hepatitis C without hepatic coma: Secondary | ICD-10-CM | POA: Insufficient documentation

## 2014-05-10 DIAGNOSIS — K7689 Other specified diseases of liver: Secondary | ICD-10-CM | POA: Insufficient documentation

## 2014-05-10 LAB — POCT I-STAT CREATININE: Creatinine, Ser: 1.2 mg/dL (ref 0.50–1.35)

## 2014-05-10 MED ORDER — IOHEXOL 300 MG/ML  SOLN
100.0000 mL | Freq: Once | INTRAMUSCULAR | Status: AC | PRN
  Administered 2014-05-10: 100 mL via INTRAVENOUS

## 2014-07-19 ENCOUNTER — Other Ambulatory Visit: Payer: Self-pay | Admitting: Internal Medicine

## 2014-07-19 DIAGNOSIS — M79604 Pain in right leg: Secondary | ICD-10-CM

## 2014-07-19 DIAGNOSIS — M545 Low back pain, unspecified: Secondary | ICD-10-CM

## 2014-07-23 ENCOUNTER — Ambulatory Visit
Admission: RE | Admit: 2014-07-23 | Discharge: 2014-07-23 | Disposition: A | Discharge status: Home / Self Care | Payer: 59 | Source: Ambulatory Visit | Attending: Internal Medicine | Admitting: Internal Medicine

## 2014-07-23 DIAGNOSIS — M545 Low back pain, unspecified: Secondary | ICD-10-CM

## 2014-07-23 DIAGNOSIS — M79604 Pain in right leg: Secondary | ICD-10-CM

## 2014-08-09 ENCOUNTER — Encounter (HOSPITAL_COMMUNITY): Payer: Self-pay | Admitting: Pharmacy Technician

## 2014-08-12 ENCOUNTER — Other Ambulatory Visit: Payer: Self-pay | Admitting: Neurological Surgery

## 2014-08-13 ENCOUNTER — Encounter (HOSPITAL_COMMUNITY)
Admission: RE | Admit: 2014-08-13 | Discharge: 2014-08-13 | Disposition: A | Discharge status: Home / Self Care | Payer: 59 | Source: Ambulatory Visit | Attending: Anesthesiology | Admitting: Anesthesiology

## 2014-08-13 ENCOUNTER — Encounter (HOSPITAL_COMMUNITY): Payer: Self-pay

## 2014-08-13 ENCOUNTER — Encounter (HOSPITAL_COMMUNITY)
Admission: RE | Admit: 2014-08-13 | Discharge: 2014-08-13 | Disposition: A | Discharge status: Home / Self Care | Payer: 59 | Source: Ambulatory Visit | Attending: Neurological Surgery | Admitting: Neurological Surgery

## 2014-08-13 DIAGNOSIS — Z01818 Encounter for other preprocedural examination: Secondary | ICD-10-CM | POA: Diagnosis not present

## 2014-08-13 DIAGNOSIS — B1920 Unspecified viral hepatitis C without hepatic coma: Secondary | ICD-10-CM | POA: Diagnosis not present

## 2014-08-13 DIAGNOSIS — Z01812 Encounter for preprocedural laboratory examination: Secondary | ICD-10-CM | POA: Diagnosis not present

## 2014-08-13 DIAGNOSIS — K219 Gastro-esophageal reflux disease without esophagitis: Secondary | ICD-10-CM | POA: Diagnosis not present

## 2014-08-13 DIAGNOSIS — E119 Type 2 diabetes mellitus without complications: Secondary | ICD-10-CM | POA: Diagnosis not present

## 2014-08-13 DIAGNOSIS — Z87891 Personal history of nicotine dependence: Secondary | ICD-10-CM | POA: Diagnosis not present

## 2014-08-13 DIAGNOSIS — Z794 Long term (current) use of insulin: Secondary | ICD-10-CM | POA: Diagnosis not present

## 2014-08-13 DIAGNOSIS — I10 Essential (primary) hypertension: Secondary | ICD-10-CM | POA: Diagnosis not present

## 2014-08-13 DIAGNOSIS — M5126 Other intervertebral disc displacement, lumbar region: Secondary | ICD-10-CM | POA: Diagnosis not present

## 2014-08-13 DIAGNOSIS — Z01810 Encounter for preprocedural cardiovascular examination: Secondary | ICD-10-CM | POA: Diagnosis not present

## 2014-08-13 HISTORY — DX: Malignant (primary) neoplasm, unspecified: C80.1

## 2014-08-13 HISTORY — DX: Unspecified asthma, uncomplicated: J45.909

## 2014-08-13 HISTORY — DX: Bronchitis, not specified as acute or chronic: J40

## 2014-08-13 HISTORY — DX: Unspecified osteoarthritis, unspecified site: M19.90

## 2014-08-13 HISTORY — DX: Gastro-esophageal reflux disease without esophagitis: K21.9

## 2014-08-13 LAB — COMPREHENSIVE METABOLIC PANEL
ALT: 54 U/L — ABNORMAL HIGH (ref 0–53)
AST: 37 U/L (ref 0–37)
Albumin: 4.2 g/dL (ref 3.5–5.2)
Alkaline Phosphatase: 68 U/L (ref 39–117)
Anion gap: 13 (ref 5–15)
BUN: 20 mg/dL (ref 6–23)
CO2: 29 mEq/L (ref 19–32)
Calcium: 10.5 mg/dL (ref 8.4–10.5)
Chloride: 97 mEq/L (ref 96–112)
Creatinine, Ser: 1.11 mg/dL (ref 0.50–1.35)
GFR calc Af Amer: 82 mL/min — ABNORMAL LOW (ref >90)
GFR calc non Af Amer: 71 mL/min — ABNORMAL LOW (ref >90)
Glucose, Bld: 124 mg/dL — ABNORMAL HIGH (ref 70–99)
Potassium: 3.7 mEq/L (ref 3.7–5.3)
Sodium: 139 mEq/L (ref 137–147)
Total Bilirubin: 0.9 mg/dL (ref 0.3–1.2)
Total Protein: 7.9 g/dL (ref 6.0–8.3)

## 2014-08-13 LAB — DIFFERENTIAL
Basophils Absolute: 0 10*3/uL (ref 0.0–0.1)
Basophils Relative: 1 % (ref 0–1)
Eosinophils Absolute: 0.1 10*3/uL (ref 0.0–0.7)
Eosinophils Relative: 2 % (ref 0–5)
Lymphocytes Relative: 31 % (ref 12–46)
Lymphs Abs: 2.4 10*3/uL (ref 0.7–4.0)
Monocytes Absolute: 0.5 10*3/uL (ref 0.1–1.0)
Monocytes Relative: 7 % (ref 3–12)
Neutro Abs: 4.7 10*3/uL (ref 1.7–7.7)
Neutrophils Relative %: 61 % (ref 43–77)

## 2014-08-13 LAB — CBC
HCT: 48.4 % (ref 39.0–52.0)
Hemoglobin: 17.3 g/dL — ABNORMAL HIGH (ref 13.0–17.0)
MCH: 32 pg (ref 26.0–34.0)
MCHC: 35.7 g/dL (ref 30.0–36.0)
MCV: 89.5 fL (ref 78.0–100.0)
Platelets: 222 10*3/uL (ref 150–400)
RBC: 5.41 MIL/uL (ref 4.22–5.81)
RDW: 12.3 % (ref 11.5–15.5)
WBC: 7.8 10*3/uL (ref 4.0–10.5)

## 2014-08-13 LAB — PROTIME-INR
INR: 0.98 (ref 0.00–1.49)
Prothrombin Time: 13 seconds (ref 11.6–15.2)

## 2014-08-13 LAB — SURGICAL PCR SCREEN
MRSA, PCR: NEGATIVE
Staphylococcus aureus: NEGATIVE

## 2014-08-13 LAB — APTT: aPTT: 32 seconds (ref 24–37)

## 2014-08-13 NOTE — Pre-Procedure Instructions (Signed)
Eddie Mullins  08/13/2014   Your procedure is scheduled on:  Wednesday August 26 th at 1514 PM  Report to [place] at 1154 PM.  Call this number if you have problems the morning of surgery: [phone]   Remember:   Do not eat food or drink liquids after midnight Tuesday   Take these medicines the morning of surgery with A SIP OF WATER: Amlodipine(Norvasc), and Omeprazole(Prilosec). No Insulin or Oral diabetic medication day of surgery.  Stop aspirin, Vitamins, herbal medication, or Nsaid today.   Do not wear jewelry.  Do not wear lotions, powders, or colognes.. You may wear deodorant.             Men may shave face and neck.  Do not bring valuables to the hospital.  [facility] is not responsible for any belongings or valuables.               Contacts, dentures or bridgework may not be worn into surgery.  Leave suitcase in the car. After surgery it may be brought to your room.  For patients admitted to the hospital, discharge time is determined by your  treatment team.               Patients discharged the day of surgery will not be allowed to drive home.    Special Instructions: [facility] - Preparing for Surgery  Before surgery, you can play an important role.  Because skin is not sterile, your skin needs to be as free of germs as possible.  You can reduce the number of germs on you skin by washing with CHG (chlorahexidine gluconate) soap before surgery.  CHG is an antiseptic cleaner which kills germs and bonds with the skin to continue killing germs even after washing.  Please DO NOT use if you have an allergy to CHG or antibacterial soaps.  If your skin becomes reddened/irritated stop using the CHG and inform your nurse when you arrive at Short Stay.  Do not shave (including legs and underarms) for at least 48 hours prior to the first CHG shower.  You may shave your face.  Please follow these instructions carefully:   1.  Shower with CHG Soap the  night before surgery and the                                morning of Surgery.  2.  If you choose to wash your hair, wash your hair first as usual with your       normal shampoo.  3.  After you shampoo, rinse your hair and body thoroughly to remove the                      Shampoo.  4.  Use CHG as you would any other liquid soap.  You can apply chg directly       to the skin and wash gently with scrungie or a clean washcloth.  5.  Apply the CHG Soap to your body ONLY FROM THE NECK DOWN.        Do not use on open wounds or open sores.  Avoid contact with your eyes,       ears, mouth and genitals (private parts).  Wash genitals (private parts)       with your normal soap.  6.  Wash thoroughly, paying special attention to the area where your  surgery        will be performed.  7.  Thoroughly rinse your body with warm water from the neck down.  8.  DO NOT shower/wash with your normal soap after using and rinsing off       the CHG Soap.  9.  Pat yourself dry with a clean towel.            10.  Wear clean pajamas.            11.  Place clean sheets on your bed the night of your first shower and do not        sleep with pets.  Day of Surgery  Do not apply any lotions/deoderants the morning of surgery.  Please wear clean clothes to the hospital/surgery center.      Please read over the following fact sheets that you were given: Pain Booklet, Coughing and Deep Breathing, MRSA Information and Surgical Site Infection Prevention

## 2014-08-13 NOTE — Progress Notes (Signed)
Dr Ronnald Ramp office made aware that orders needed to be signed in Epic for patient's 1200 PAT appointment.

## 2014-08-14 ENCOUNTER — Ambulatory Visit (HOSPITAL_COMMUNITY)
Admission: RE | Admit: 2014-08-14 | Discharge: 2014-08-15 | Disposition: A | Discharge status: Home / Self Care | Payer: 59 | Source: Ambulatory Visit | Attending: Neurological Surgery | Admitting: Neurological Surgery

## 2014-08-14 ENCOUNTER — Encounter (HOSPITAL_COMMUNITY): Payer: 59 | Admitting: Anesthesiology

## 2014-08-14 ENCOUNTER — Encounter (HOSPITAL_COMMUNITY)
Admission: RE | Disposition: A | Discharge status: Home / Self Care | Payer: Self-pay | Source: Ambulatory Visit | Attending: Neurological Surgery

## 2014-08-14 ENCOUNTER — Encounter (HOSPITAL_COMMUNITY): Payer: Self-pay

## 2014-08-14 ENCOUNTER — Ambulatory Visit (HOSPITAL_COMMUNITY): Payer: 59 | Admitting: Anesthesiology

## 2014-08-14 ENCOUNTER — Ambulatory Visit (HOSPITAL_COMMUNITY): Payer: 59

## 2014-08-14 DIAGNOSIS — Z9889 Other specified postprocedural states: Secondary | ICD-10-CM

## 2014-08-14 DIAGNOSIS — B1920 Unspecified viral hepatitis C without hepatic coma: Secondary | ICD-10-CM | POA: Insufficient documentation

## 2014-08-14 DIAGNOSIS — Z01812 Encounter for preprocedural laboratory examination: Secondary | ICD-10-CM | POA: Insufficient documentation

## 2014-08-14 DIAGNOSIS — I10 Essential (primary) hypertension: Secondary | ICD-10-CM | POA: Insufficient documentation

## 2014-08-14 DIAGNOSIS — Z01810 Encounter for preprocedural cardiovascular examination: Secondary | ICD-10-CM | POA: Insufficient documentation

## 2014-08-14 DIAGNOSIS — K219 Gastro-esophageal reflux disease without esophagitis: Secondary | ICD-10-CM | POA: Insufficient documentation

## 2014-08-14 DIAGNOSIS — Z87891 Personal history of nicotine dependence: Secondary | ICD-10-CM | POA: Insufficient documentation

## 2014-08-14 DIAGNOSIS — E119 Type 2 diabetes mellitus without complications: Secondary | ICD-10-CM | POA: Insufficient documentation

## 2014-08-14 DIAGNOSIS — Z01818 Encounter for other preprocedural examination: Secondary | ICD-10-CM | POA: Insufficient documentation

## 2014-08-14 DIAGNOSIS — M5126 Other intervertebral disc displacement, lumbar region: Secondary | ICD-10-CM | POA: Diagnosis not present

## 2014-08-14 DIAGNOSIS — Z794 Long term (current) use of insulin: Secondary | ICD-10-CM | POA: Insufficient documentation

## 2014-08-14 HISTORY — PX: LUMBAR LAMINECTOMY/DECOMPRESSION MICRODISCECTOMY: SHX5026

## 2014-08-14 LAB — GLUCOSE, CAPILLARY
Glucose-Capillary: 108 mg/dL — ABNORMAL HIGH (ref 70–99)
Glucose-Capillary: 99 mg/dL (ref 70–99)
Glucose-Capillary: 187 mg/dL — ABNORMAL HIGH (ref 70–99)

## 2014-08-14 SURGERY — LUMBAR LAMINECTOMY/DECOMPRESSION MICRODISCECTOMY 1 LEVEL
Anesthesia: General | Site: Back | Laterality: Right

## 2014-08-14 MED ORDER — OXYCODONE-ACETAMINOPHEN 5-325 MG PO TABS
1.0000 | ORAL_TABLET | ORAL | Status: DC | PRN
  Administered 2014-08-15: 2 via ORAL
  Filled 2014-08-14: qty 2

## 2014-08-14 MED ORDER — INSULIN GLARGINE 100 UNIT/ML ~~LOC~~ SOLN
50.0000 [IU] | Freq: Every day | SUBCUTANEOUS | Status: DC
  Administered 2014-08-14: 50 [IU] via SUBCUTANEOUS
  Filled 2014-08-14 (×2): qty 0.5

## 2014-08-14 MED ORDER — INSULIN GLARGINE 100 UNITS/ML SOLOSTAR PEN: 50.0000 [IU] | PEN_INJECTOR | Freq: Every day | SUBCUTANEOUS | Status: DC

## 2014-08-14 MED ORDER — LIDOCAINE HCL (CARDIAC) 20 MG/ML IV SOLN
INTRAVENOUS | Status: AC
  Filled 2014-08-14: qty 5

## 2014-08-14 MED ORDER — ROCURONIUM BROMIDE 100 MG/10ML IV SOLN
INTRAVENOUS | Status: DC | PRN
  Administered 2014-08-14: 50 mg via INTRAVENOUS

## 2014-08-14 MED ORDER — SODIUM CHLORIDE 0.9 % IJ SOLN: 3.0000 mL | INTRAMUSCULAR | Status: DC | PRN

## 2014-08-14 MED ORDER — FENTANYL CITRATE 0.05 MG/ML IJ SOLN
INTRAMUSCULAR | Status: AC
  Filled 2014-08-14: qty 5

## 2014-08-14 MED ORDER — MIDAZOLAM HCL 2 MG/2ML IJ SOLN
INTRAMUSCULAR | Status: AC
  Filled 2014-08-14: qty 2

## 2014-08-14 MED ORDER — HYDROMORPHONE HCL PF 1 MG/ML IJ SOLN
0.2500 mg | INTRAMUSCULAR | Status: DC | PRN
  Administered 2014-08-14 (×4): 0.5 mg via INTRAVENOUS

## 2014-08-14 MED ORDER — MORPHINE SULFATE 2 MG/ML IJ SOLN
1.0000 mg | INTRAMUSCULAR | Status: DC | PRN
  Administered 2014-08-14: 4 mg via INTRAVENOUS
  Filled 2014-08-14: qty 2

## 2014-08-14 MED ORDER — LIDOCAINE HCL (CARDIAC) 20 MG/ML IV SOLN
INTRAVENOUS | Status: DC | PRN
  Administered 2014-08-14: 80 mg via INTRAVENOUS

## 2014-08-14 MED ORDER — MENTHOL 3 MG MT LOZG: 1.0000 | LOZENGE | OROMUCOSAL | Status: DC | PRN

## 2014-08-14 MED ORDER — LOSARTAN POTASSIUM-HCTZ 100-25 MG PO TABS: 1.0000 | ORAL_TABLET | Freq: Every day | ORAL | Status: DC

## 2014-08-14 MED ORDER — ONDANSETRON HCL 4 MG/2ML IJ SOLN
INTRAMUSCULAR | Status: AC
  Filled 2014-08-14: qty 2

## 2014-08-14 MED ORDER — HYDROMORPHONE HCL PF 1 MG/ML IJ SOLN
INTRAMUSCULAR | Status: AC
  Filled 2014-08-14: qty 1

## 2014-08-14 MED ORDER — PROPOFOL 10 MG/ML IV BOLUS
INTRAVENOUS | Status: AC
  Filled 2014-08-14: qty 20

## 2014-08-14 MED ORDER — ARTIFICIAL TEARS OP OINT
TOPICAL_OINTMENT | OPHTHALMIC | Status: AC
  Filled 2014-08-14: qty 3.5

## 2014-08-14 MED ORDER — ONDANSETRON HCL 4 MG/2ML IJ SOLN: 4.0000 mg | Freq: Four times a day (QID) | INTRAMUSCULAR | Status: DC | PRN

## 2014-08-14 MED ORDER — POTASSIUM CHLORIDE IN NACL 20-0.9 MEQ/L-% IV SOLN
INTRAVENOUS | Status: DC
  Filled 2014-08-14 (×2): qty 1000

## 2014-08-14 MED ORDER — ARTIFICIAL TEARS OP OINT
TOPICAL_OINTMENT | OPHTHALMIC | Status: DC | PRN
  Administered 2014-08-14: 1 via OPHTHALMIC

## 2014-08-14 MED ORDER — HYDROMORPHONE HCL PF 1 MG/ML IJ SOLN
0.2500 mg | INTRAMUSCULAR | Status: DC | PRN
  Administered 2014-08-14 (×3): 0.5 mg via INTRAVENOUS

## 2014-08-14 MED ORDER — CEFAZOLIN SODIUM-DEXTROSE 2-3 GM-% IV SOLR
2.0000 g | Freq: Once | INTRAVENOUS | Status: AC
  Administered 2014-08-14: 2 g via INTRAVENOUS

## 2014-08-14 MED ORDER — DEXAMETHASONE 4 MG PO TABS
4.0000 mg | ORAL_TABLET | Freq: Four times a day (QID) | ORAL | Status: DC
  Administered 2014-08-15 (×2): 4 mg via ORAL
  Filled 2014-08-14 (×6): qty 1

## 2014-08-14 MED ORDER — GLYCOPYRROLATE 0.2 MG/ML IJ SOLN
INTRAMUSCULAR | Status: AC
  Filled 2014-08-14: qty 1

## 2014-08-14 MED ORDER — CEFAZOLIN SODIUM-DEXTROSE 2-3 GM-% IV SOLR
INTRAVENOUS | Status: AC
  Filled 2014-08-14: qty 50

## 2014-08-14 MED ORDER — HEMOSTATIC AGENTS (NO CHARGE) OPTIME
TOPICAL | Status: DC | PRN
  Administered 2014-08-14: 1 via TOPICAL

## 2014-08-14 MED ORDER — PROPOFOL 10 MG/ML IV BOLUS
INTRAVENOUS | Status: DC | PRN
  Administered 2014-08-14: 200 mg via INTRAVENOUS

## 2014-08-14 MED ORDER — THROMBIN 5000 UNITS EX SOLR
CUTANEOUS | Status: DC | PRN
Start: 2014-08-14 — End: 2014-08-14
  Administered 2014-08-14 (×2): 5000 [IU] via TOPICAL

## 2014-08-14 MED ORDER — HYDROCHLOROTHIAZIDE 25 MG PO TABS
25.0000 mg | ORAL_TABLET | Freq: Every day | ORAL | Status: DC
  Administered 2014-08-14: 25 mg via ORAL
  Filled 2014-08-14 (×2): qty 1

## 2014-08-14 MED ORDER — NEOSTIGMINE METHYLSULFATE 10 MG/10ML IV SOLN
INTRAVENOUS | Status: DC | PRN
  Administered 2014-08-14: 2 mg via INTRAVENOUS

## 2014-08-14 MED ORDER — GLYCOPYRROLATE 0.2 MG/ML IJ SOLN
INTRAMUSCULAR | Status: DC | PRN
  Administered 2014-08-14: 0.3 mg via INTRAVENOUS

## 2014-08-14 MED ORDER — SODIUM CHLORIDE 0.9 % IJ SOLN
INTRAMUSCULAR | Status: AC
  Filled 2014-08-14: qty 10

## 2014-08-14 MED ORDER — ROCURONIUM BROMIDE 50 MG/5ML IV SOLN
INTRAVENOUS | Status: AC
  Filled 2014-08-14: qty 1

## 2014-08-14 MED ORDER — LACTATED RINGERS IV SOLN
INTRAVENOUS | Status: DC
  Administered 2014-08-14 (×2): via INTRAVENOUS

## 2014-08-14 MED ORDER — METHOCARBAMOL 1000 MG/10ML IJ SOLN
500.0000 mg | Freq: Four times a day (QID) | INTRAVENOUS | Status: DC | PRN
  Administered 2014-08-14: 500 mg via INTRAVENOUS
  Filled 2014-08-14: qty 5

## 2014-08-14 MED ORDER — ACETAMINOPHEN 325 MG PO TABS: 650.0000 mg | ORAL_TABLET | ORAL | Status: DC | PRN

## 2014-08-14 MED ORDER — LOSARTAN POTASSIUM 50 MG PO TABS
100.0000 mg | ORAL_TABLET | Freq: Every day | ORAL | Status: DC
  Administered 2014-08-14: 100 mg via ORAL
  Filled 2014-08-14 (×2): qty 2

## 2014-08-14 MED ORDER — PANTOPRAZOLE SODIUM 40 MG PO TBEC
40.0000 mg | DELAYED_RELEASE_TABLET | Freq: Every day | ORAL | Status: DC
  Administered 2014-08-14: 40 mg via ORAL
  Filled 2014-08-14: qty 1

## 2014-08-14 MED ORDER — MIDAZOLAM HCL 5 MG/5ML IJ SOLN
INTRAMUSCULAR | Status: DC | PRN
  Administered 2014-08-14: 2 mg via INTRAVENOUS

## 2014-08-14 MED ORDER — DEXAMETHASONE SODIUM PHOSPHATE 4 MG/ML IJ SOLN
4.0000 mg | Freq: Four times a day (QID) | INTRAMUSCULAR | Status: DC
  Filled 2014-08-14 (×4): qty 1

## 2014-08-14 MED ORDER — HYDROMORPHONE HCL PF 1 MG/ML IJ SOLN
INTRAMUSCULAR | Status: AC
  Administered 2014-08-14: 0.5 mg
  Filled 2014-08-14: qty 1

## 2014-08-14 MED ORDER — PHENOL 1.4 % MT LIQD: 1.0000 | OROMUCOSAL | Status: DC | PRN

## 2014-08-14 MED ORDER — FENTANYL CITRATE 0.05 MG/ML IJ SOLN
INTRAMUSCULAR | Status: DC | PRN
  Administered 2014-08-14: 100 ug via INTRAVENOUS
  Administered 2014-08-14: 50 ug via INTRAVENOUS

## 2014-08-14 MED ORDER — GELATIN ABSORBABLE MT POWD
OROMUCOSAL | Status: DC | PRN
  Administered 2014-08-14: 17:00:00 via TOPICAL

## 2014-08-14 MED ORDER — OXYCODONE HCL 5 MG PO TABS
ORAL_TABLET | ORAL | Status: AC
  Filled 2014-08-14: qty 1

## 2014-08-14 MED ORDER — OXYCODONE HCL 5 MG PO TABS
5.0000 mg | ORAL_TABLET | Freq: Once | ORAL | Status: AC | PRN
  Administered 2014-08-14: 5 mg via ORAL

## 2014-08-14 MED ORDER — OXYCODONE HCL 5 MG/5ML PO SOLN: 5.0000 mg | Freq: Once | ORAL | Status: AC | PRN

## 2014-08-14 MED ORDER — 0.9 % SODIUM CHLORIDE (POUR BTL) OPTIME
TOPICAL | Status: DC | PRN
  Administered 2014-08-14: 1000 mL

## 2014-08-14 MED ORDER — SODIUM CHLORIDE 0.9 % IR SOLN
Status: DC | PRN
  Administered 2014-08-14: 17:00:00

## 2014-08-14 MED ORDER — METHYLPHENIDATE HCL 5 MG PO TABS: 20.0000 mg | ORAL_TABLET | Freq: Every day | ORAL | Status: DC

## 2014-08-14 MED ORDER — EPHEDRINE SULFATE 50 MG/ML IJ SOLN
INTRAMUSCULAR | Status: AC
Start: 2014-08-14 — End: 2014-08-14
  Filled 2014-08-14: qty 1

## 2014-08-14 MED ORDER — ONDANSETRON HCL 4 MG/2ML IJ SOLN
INTRAMUSCULAR | Status: DC | PRN
  Administered 2014-08-14: 4 mg via INTRAVENOUS

## 2014-08-14 MED ORDER — HYDROMORPHONE HCL PF 1 MG/ML IJ SOLN: 0.5000 mg | Freq: Once | INTRAMUSCULAR | Status: DC

## 2014-08-14 MED ORDER — LEDIPASVIR-SOFOSBUVIR 90-400 MG PO TABS
1.0000 | ORAL_TABLET | Freq: Every day | ORAL | Status: DC
  Filled 2014-08-14: qty 1

## 2014-08-14 MED ORDER — SODIUM CHLORIDE 0.9 % IV SOLN: 250.0000 mL | INTRAVENOUS | Status: DC

## 2014-08-14 MED ORDER — AMLODIPINE BESYLATE 5 MG PO TABS
5.0000 mg | ORAL_TABLET | Freq: Every day | ORAL | Status: DC
  Administered 2014-08-14: 5 mg via ORAL
  Filled 2014-08-14 (×2): qty 1

## 2014-08-14 MED ORDER — SENNA 8.6 MG PO TABS
1.0000 | ORAL_TABLET | Freq: Two times a day (BID) | ORAL | Status: DC
  Administered 2014-08-14: 8.6 mg via ORAL
  Filled 2014-08-14 (×2): qty 1

## 2014-08-14 MED ORDER — SODIUM CHLORIDE 0.9 % IJ SOLN: 3.0000 mL | Freq: Two times a day (BID) | INTRAMUSCULAR | Status: DC

## 2014-08-14 MED ORDER — BUPIVACAINE HCL (PF) 0.25 % IJ SOLN
INTRAMUSCULAR | Status: DC | PRN
  Administered 2014-08-14: 1 mL

## 2014-08-14 MED ORDER — ONDANSETRON HCL 4 MG/2ML IJ SOLN
4.0000 mg | INTRAMUSCULAR | Status: DC | PRN
Start: 2014-08-14 — End: 2014-08-15

## 2014-08-14 MED ORDER — ACETAMINOPHEN 650 MG RE SUPP: 650.0000 mg | RECTAL | Status: DC | PRN

## 2014-08-14 MED ORDER — METHOCARBAMOL 500 MG PO TABS
500.0000 mg | ORAL_TABLET | Freq: Four times a day (QID) | ORAL | Status: DC | PRN
  Administered 2014-08-15: 500 mg via ORAL
  Filled 2014-08-14 (×2): qty 1

## 2014-08-14 MED ORDER — CEFAZOLIN SODIUM 1-5 GM-% IV SOLN
1.0000 g | Freq: Three times a day (TID) | INTRAVENOUS | Status: DC
  Administered 2014-08-15: 1 g via INTRAVENOUS
  Filled 2014-08-14: qty 50

## 2014-08-14 SURGICAL SUPPLY — 47 items
BAG DECANTER FOR FLEXI CONT (MISCELLANEOUS) ×2 IMPLANT
BENZOIN TINCTURE PRP APPL 2/3 (GAUZE/BANDAGES/DRESSINGS) ×2 IMPLANT
BUR MATCHSTICK NEURO 3.0 LAGG (BURR) ×2 IMPLANT
CANISTER SUCT 3000ML (MISCELLANEOUS) ×2 IMPLANT
CONT SPEC 4OZ CLIKSEAL STRL BL (MISCELLANEOUS) ×2 IMPLANT
DRAPE LAPAROTOMY 100X72X124 (DRAPES) ×2 IMPLANT
DRAPE MICROSCOPE LEICA (MISCELLANEOUS) ×2 IMPLANT
DRAPE POUCH INSTRU U-SHP 10X18 (DRAPES) ×2 IMPLANT
DRAPE SURG 17X23 STRL (DRAPES) ×2 IMPLANT
DRSG OPSITE 4X5.5 SM (GAUZE/BANDAGES/DRESSINGS) ×2 IMPLANT
DRSG OPSITE POSTOP 4X6 (GAUZE/BANDAGES/DRESSINGS) ×2 IMPLANT
DRSG TELFA 3X8 NADH (GAUZE/BANDAGES/DRESSINGS) ×2 IMPLANT
DURAPREP 26ML APPLICATOR (WOUND CARE) ×2 IMPLANT
ELECT REM PT RETURN 9FT ADLT (ELECTROSURGICAL) ×2
ELECTRODE REM PT RTRN 9FT ADLT (ELECTROSURGICAL) ×1 IMPLANT
GAUZE SPONGE 4X4 16PLY XRAY LF (GAUZE/BANDAGES/DRESSINGS) IMPLANT
GLOVE BIO SURGEON STRL SZ8 (GLOVE) ×2 IMPLANT
GLOVE BIO SURGEON STRL SZ8.5 (GLOVE) ×2 IMPLANT
GLOVE BIOGEL PI IND STRL 8.5 (GLOVE) ×1 IMPLANT
GLOVE BIOGEL PI INDICATOR 8.5 (GLOVE) ×1
GLOVE ECLIPSE 7.5 STRL STRAW (GLOVE) ×2 IMPLANT
GLOVE SS BIOGEL STRL SZ 8 (GLOVE) ×1 IMPLANT
GLOVE SUPERSENSE BIOGEL SZ 8 (GLOVE) ×1
GOWN STRL REUS W/ TWL LRG LVL3 (GOWN DISPOSABLE) IMPLANT
GOWN STRL REUS W/ TWL XL LVL3 (GOWN DISPOSABLE) ×2 IMPLANT
GOWN STRL REUS W/TWL 2XL LVL3 (GOWN DISPOSABLE) IMPLANT
GOWN STRL REUS W/TWL LRG LVL3 (GOWN DISPOSABLE)
GOWN STRL REUS W/TWL XL LVL3 (GOWN DISPOSABLE) ×2
HEMOSTAT POWDER KIT SURGIFOAM (HEMOSTASIS) ×2 IMPLANT
KIT BASIN OR (CUSTOM PROCEDURE TRAY) ×2 IMPLANT
KIT ROOM TURNOVER OR (KITS) ×2 IMPLANT
NEEDLE HYPO 25X1 1.5 SAFETY (NEEDLE) ×2 IMPLANT
NEEDLE SPNL 20GX3.5 QUINCKE YW (NEEDLE) IMPLANT
NS IRRIG 1000ML POUR BTL (IV SOLUTION) ×2 IMPLANT
PACK LAMINECTOMY NEURO (CUSTOM PROCEDURE TRAY) ×2 IMPLANT
PAD ARMBOARD 7.5X6 YLW CONV (MISCELLANEOUS) ×6 IMPLANT
RUBBERBAND STERILE (MISCELLANEOUS) ×4 IMPLANT
SPONGE SURGIFOAM ABS GEL SZ50 (HEMOSTASIS) ×2 IMPLANT
STRIP CLOSURE SKIN 1/2X4 (GAUZE/BANDAGES/DRESSINGS) ×2 IMPLANT
SUT VIC AB 0 CT1 18XCR BRD8 (SUTURE) ×1 IMPLANT
SUT VIC AB 0 CT1 8-18 (SUTURE) ×1
SUT VIC AB 2-0 CP2 18 (SUTURE) ×2 IMPLANT
SUT VIC AB 3-0 SH 8-18 (SUTURE) ×2 IMPLANT
SYR 20ML ECCENTRIC (SYRINGE) ×2 IMPLANT
TOWEL OR 17X24 6PK STRL BLUE (TOWEL DISPOSABLE) ×2 IMPLANT
TOWEL OR 17X26 10 PK STRL BLUE (TOWEL DISPOSABLE) ×2 IMPLANT
WATER STERILE IRR 1000ML POUR (IV SOLUTION) ×2 IMPLANT

## 2014-08-14 NOTE — Op Note (Signed)
08/14/2014  6:03 PM  PATIENT:  Eddie Mullins  57 y.o. male  PRE-OPERATIVE DIAGNOSIS: Midline disc protrusion at L2-3 with severe spinal stenosis, back and right leg pain  POST-OPERATIVE DIAGNOSIS:  Same  PROCEDURE:  Decompressive lumbar laminectomy, medial facetectomy and bilateral foraminotomies followed by microdiscectomy utilizing microscopic dissection  SURGEON:  Sherley Bounds, MD  ASSISTANTS: Dr. Arnoldo Morale  ANESTHESIA:   General  EBL: 100 ml  Total I/O In: 1000 [I.V.:1000] Out: 100 [Blood:100]  BLOOD ADMINISTERED:none  DRAINS: None   SPECIMEN:  No Specimen  INDICATION FOR PROCEDURE: This patient presented with severe right leg pain. He had an MRI which showed a herniated disc at L2-3 and the midline causing severe spinal stenosis. It looked to be a large midline free fragment. I recommended decompressive laminectomy with microdiscectomy. Patient understood the risks, benefits, and alternatives and potential outcomes and wished to proceed.  PROCEDURE DETAILS: The patient was taken to the operating room and after induction of adequate generalized endotracheal anesthesia, the patient was rolled into the prone position on the Wilson frame and all pressure points were padded. The lumbar region was cleaned and then prepped with DuraPrep and draped in the usual sterile fashion. 5 cc of local anesthesia was injected and then a dorsal midline incision was made and carried down to the lumbo sacral fascia. The fascia was opened and the paraspinous musculature was taken down in a subperiosteal fashion to expose L2-3. Intraoperative x-ray confirmed my level, and then I used a combination of the high-speed drill and the Kerrison punches to perform a hemilaminectomy, medial facetectomy, and foraminotomy at L2-3 bilaterally. The underlying yellow ligament was opened and removed in a piecemeal fashion to expose the underlying dura and exiting nerve root bilaterally. I undercut the lateral recess  and dissected down until I was medial to and distal to the pedicle. The nerve root was well decompressed. We then gently retracted the nerve root medially with a retractor, coagulated the epidural venous vasculature, and inspected the disc space. I found significant amounts of free fragment in the midline was able to remove this with I nerve hook and a coronary dilator while palpating in the midline. I then palpated with a coronary dilator along the nerve root and into the foramen to assure adequate decompression. I felt no more compression of the nerve root. I irrigated with saline solution containing bacitracin. Achieved hemostasis with bipolar cautery, lined the dura with Gelfoam, and then closed the fascia with 0 Vicryl. I closed the subcutaneous tissues with 2-0 Vicryl and the subcuticular tissues with 3-0 Vicryl. The skin was then closed with benzoin and Steri-Strips. The drapes were removed, a sterile dressing was applied. The patient was awakened from general anesthesia and transferred to the recovery room in stable condition. At the end of the procedure all sponge, needle and instrument counts were correct.   PLAN OF CARE: Admit for overnight observation  PATIENT DISPOSITION:  PACU - hemodynamically stable.   Delay start of Pharmacological VTE agent (>24hrs) due to surgical blood loss or risk of bleeding:  yes

## 2014-08-14 NOTE — H&P (Signed)
Subjective: Patient is a 58 y.o. male admitted for L2-3 decompressive laminectomy and microdiscectomy. Onset of symptoms was several weeks ago, gradually worsening since that time.  The pain is rated severe, and is located at the across the lower back and radiates to right lower extremity. The pain is described as aching and occurs all day. The symptoms have been progressive. Symptoms are exacerbated by exercise and standing. MRI or CT showed large herniated disc with severe spinal stenosis L2 -3   Past Medical History  Diagnosis Date  . Diabetes mellitus type II   . Hepatitis C   . Hypertension   . Asthma     as a child  . Bronchitis   . GERD (gastroesophageal reflux disease)   . Arthritis   . Cancer     basal skin ca    Past Surgical History  Procedure Laterality Date  . Cholecystectomy    . Knee surgery    . Rotator cuff repair    . Acromio-clavicular joint repair    . Joint replacement    . Tonsillectomy    . Colonoscopy    . Cardiac catheterization  2005    clear results.    Prior to Admission medications   Medication Sig Start Date End Date Taking? Authorizing Provider  amLODipine (NORVASC) 5 MG tablet Take 5 mg by mouth at bedtime.  01/25/13  Yes Historical Provider, MD  ibuprofen (ADVIL,MOTRIN) 200 MG tablet Take 800 mg by mouth every 6 (six) hours as needed (pain).   Yes Historical Provider, MD  insulin glargine (LANTUS) 100 unit/mL SOPN Inject 50 Units into the skin at bedtime.   Yes Historical Provider, MD  Javier Docker Oil (HM MEGAKRILL PO) Take 500 mg by mouth daily.   Yes Historical Provider, MD  Ledipasvir-Sofosbuvir (HARVONI) 90-400 MG TABS Take 1 tablet by mouth daily.   Yes Historical Provider, MD  losartan-hydrochlorothiazide (HYZAAR) 100-25 MG per tablet Take 1 tablet by mouth at bedtime.    Yes Historical Provider, MD  methylphenidate (RITALIN) 20 MG tablet Take 20 mg by mouth daily. 01/10/12  Yes Kathlee Nations, MD  Multiple Vitamins-Minerals (MULTIVITAMIN WITH  MINERALS) tablet Take 1 tablet by mouth daily.   Yes Historical Provider, MD  omeprazole (PRILOSEC) 20 MG capsule Take 20 mg by mouth daily.   Yes Historical Provider, MD   No Known Allergies  History  Substance Use Topics  . Smoking status: Former Research scientist (life sciences)  . Smokeless tobacco: Former Systems developer    Quit date: 03/13/1999  . Alcohol Use: No     Comment: stopped drinking wine    Family History  Problem Relation Age of Onset  . Hypertension Mother      Review of Systems  Positive ROS: neg  All other systems have been reviewed and were otherwise negative with the exception of those mentioned in the HPI and as above.  Objective: Vital signs in last 24 hours: Temp:  [98.2 F (36.8 C)] 98.2 F (36.8 C) (08/26 1430) Pulse Rate:  [87] 87 (08/26 1430) Resp:  [20] 20 (08/26 1430) BP: (134)/(85) 134/85 mmHg (08/26 1430) SpO2:  [98 %] 98 % (08/26 1430) Weight:  [98.158 kg (216 lb 6.4 oz)] 98.158 kg (216 lb 6.4 oz) (08/26 1430)  General Appearance: Alert, cooperative, no distress, appears stated age Head: Normocephalic, without obvious abnormality, atraumatic Eyes: PERRL, conjunctiva/corneas clear, EOM's intact    Neck: Supple, symmetrical, trachea midline Back: Symmetric, no curvature, ROM normal, no CVA tenderness Lungs:  respirations unlabored Heart: Regular  rate and rhythm Abdomen: Soft, non-tender Extremities: Extremities normal, atraumatic, no cyanosis or edema Pulses: 2+ and symmetric all extremities Skin: Skin color, texture, turgor normal, no rashes or lesions  NEUROLOGIC:   Mental status: Alert and oriented x4,  no aphasia, good attention span, fund of knowledge, and memory Motor Exam - grossly normal Sensory Exam - grossly normal Reflexes: 1+ Coordination - grossly normal Gait - grossly normal Balance - grossly normal Cranial Nerves: I: smell Not tested  II: visual acuity  OS: nl    OD: nl  II: visual fields Full to confrontation  II: pupils Equal, round, reactive to  light  III,VII: ptosis None  III,IV,VI: extraocular muscles  Full ROM  V: mastication Normal  V: facial light touch sensation  Normal  V,VII: corneal reflex  Present  VII: facial muscle function - upper  Normal  VII: facial muscle function - lower Normal  VIII: hearing Not tested  IX: soft palate elevation  Normal  IX,X: gag reflex Present  XI: trapezius strength  5/5  XI: sternocleidomastoid strength 5/5  XI: neck flexion strength  5/5  XII: tongue strength  Normal    Data Review Lab Results  Component Value Date   WBC 7.8 08/13/2014   HGB 17.3* 08/13/2014   HCT 48.4 08/13/2014   MCV 89.5 08/13/2014   PLT 222 08/13/2014   Lab Results  Component Value Date   NA 139 08/13/2014   K 3.7 08/13/2014   CL 97 08/13/2014   CO2 29 08/13/2014   BUN 20 08/13/2014   CREATININE 1.11 08/13/2014   GLUCOSE 124* 08/13/2014   Lab Results  Component Value Date   INR 0.98 08/13/2014    Assessment/Plan: Patient admitted for L2-3 decompression and discectomy. Patient has failed a reasonable attempt at conservative therapy.  I explained the condition and procedure to the patient and answered any questions.  Patient wishes to proceed with procedure as planned. Understands risks/ benefits and typical outcomes of procedure.   Ladarious Kresse S 08/14/2014 3:58 PM

## 2014-08-14 NOTE — Anesthesia Procedure Notes (Signed)
Procedure Name: Intubation Date/Time: 08/14/2014 4:19 PM Performed by: Sampson Si E Pre-anesthesia Checklist: Patient identified, Emergency Drugs available, Suction available, Patient being monitored and Timeout performed Patient Re-evaluated:Patient Re-evaluated prior to inductionOxygen Delivery Method: Circle system utilized Preoxygenation: Pre-oxygenation with 100% oxygen Intubation Type: IV induction Ventilation: Mask ventilation without difficulty Laryngoscope Size: Mac and 3 Grade View: Grade I Tube type: Oral Tube size: 7.5 mm Number of attempts: 1 Airway Equipment and Method: Stylet Placement Confirmation: ETT inserted through vocal cords under direct vision,  positive ETCO2 and breath sounds checked- equal and bilateral Secured at: 22 cm Tube secured with: Tape Dental Injury: Teeth and Oropharynx as per pre-operative assessment

## 2014-08-14 NOTE — Anesthesia Preprocedure Evaluation (Addendum)
Anesthesia Evaluation  Patient identified by MRN, date of birth, ID band Patient awake    Reviewed: Allergy & Precautions, H&P , NPO status , Patient's Chart, lab work & pertinent test results  Airway Mallampati: II TM Distance: >3 FB Neck ROM: full    Dental  (+) Teeth Intact, Dental Advisory Given   Pulmonary asthma , former smoker,          Cardiovascular hypertension, Pt. on medications     Neuro/Psych    GI/Hepatic GERD-  Medicated and Controlled,(+) Hepatitis -, C  Endo/Other  diabetes, Type 2  Renal/GU      Musculoskeletal  (+) Arthritis -,   Abdominal   Peds  Hematology   Anesthesia Other Findings   Reproductive/Obstetrics                         Anesthesia Physical Anesthesia Plan  ASA: II  Anesthesia Plan: General   Post-op Pain Management:    Induction: Intravenous  Airway Management Planned: Oral ETT  Additional Equipment:   Intra-op Plan:   Post-operative Plan: Extubation in OR  Informed Consent: I have reviewed the patients History and Physical, chart, labs and discussed the procedure including the risks, benefits and alternatives for the proposed anesthesia with the patient or authorized representative who has indicated his/her understanding and acceptance.     Plan Discussed with: CRNA, Anesthesiologist and Surgeon  Anesthesia Plan Comments:         Anesthesia Quick Evaluation

## 2014-08-14 NOTE — Plan of Care (Signed)
Problem: Consults Goal: Diagnosis - Spinal Surgery Outcome: Completed/Met Date Met:  08/14/14 Lumbar Laminectomy (Complex)

## 2014-08-14 NOTE — Anesthesia Postprocedure Evaluation (Signed)
  Anesthesia Post-op Note  Patient: Eddie Mullins  Procedure(s) Performed: Procedure(s): LUMBAR LAMINECTOMY/DECOMPRESSION MICRODISCECTOMY LUMBAR TWO-THREE (Right)  Patient Location: PACU  Anesthesia Type:General  Level of Consciousness: awake, alert , oriented and patient cooperative  Airway and Oxygen Therapy: Patient Spontanous Breathing  Post-op Pain: mild  Post-op Assessment: Post-op Vital signs reviewed, Patient's Cardiovascular Status Stable, Respiratory Function Stable, Patent Airway, No signs of Nausea or vomiting and Pain level controlled  Post-op Vital Signs: stable  Last Vitals:  Filed Vitals:   08/14/14 1809  BP:   Pulse:   Temp: 36.9 C  Resp:     Complications: No apparent anesthesia complications

## 2014-08-14 NOTE — Transfer of Care (Signed)
Immediate Anesthesia Transfer of Care Note  Patient: Eddie Mullins  Procedure(s) Performed: Procedure(s): LUMBAR LAMINECTOMY/DECOMPRESSION MICRODISCECTOMY LUMBAR TWO-THREE (Right)  Patient Location: PACU  Anesthesia Type:General  Level of Consciousness: awake and oriented  Airway & Oxygen Therapy: Patient Spontanous Breathing and Patient connected to nasal cannula oxygen  Post-op Assessment: Report given to PACU RN and Patient moving all extremities X 4  Post vital signs: Reviewed and stable  Complications: No apparent anesthesia complications

## 2014-08-15 DIAGNOSIS — M5126 Other intervertebral disc displacement, lumbar region: Secondary | ICD-10-CM | POA: Diagnosis not present

## 2014-08-15 LAB — GLUCOSE, CAPILLARY: Glucose-Capillary: 167 mg/dL — ABNORMAL HIGH (ref 70–99)

## 2014-08-15 MED ORDER — OXYCODONE HCL 5 MG PO TABS
5.0000 mg | ORAL_TABLET | ORAL | Status: DC | PRN
  Administered 2014-08-15 (×2): 10 mg via ORAL
  Filled 2014-08-15 (×2): qty 2

## 2014-08-15 MED ORDER — OXYCODONE HCL 5 MG PO TABS: 5.0000 mg | ORAL_TABLET | ORAL | Status: DC | PRN

## 2014-08-15 NOTE — Discharge Summary (Signed)
Physician Discharge Summary  Patient ID: Eddie Mullins MRN: [id number] DOB/AGE: 1956/07/04 58 y.o.  Admit date: 08/14/2014 Discharge date: 08/15/2014  Admission Diagnoses: spinal stenosis/ HNP    Discharge Diagnoses: same   Discharged Condition: good  Hospital Course: The patient was admitted on 08/14/2014 and taken to the operating room where the patient underwent DLL L2-3 with microdiskectomy. The patient tolerated the procedure well and was taken to the recovery room and then to the floor in stable condition. The hospital course was routine. There were no complications. The wound remained clean dry and intact. Pt had appropriate back soreness. No complaints of leg pain or new N/T/W. The patient remained afebrile with stable vital signs, and tolerated a regular diet. The patient continued to increase activities, and pain was well controlled with oral pain medications.   Consults: None  Significant Diagnostic Studies:  Results for orders placed during the hospital encounter of 08/14/14  GLUCOSE, CAPILLARY      Result Value Ref Range   Glucose-Capillary 108 (*) 70 - 99 mg/dL  GLUCOSE, CAPILLARY      Result Value Ref Range   Glucose-Capillary 99  70 - 99 mg/dL   Comment 1 Notify RN    GLUCOSE, CAPILLARY      Result Value Ref Range   Glucose-Capillary 187 (*) 70 - 99 mg/dL   Comment 1 Notify RN     Comment 2 Documented in Chart      Dg Chest 2 View  08/13/2014   CLINICAL DATA:  Hypertension.  EXAM: CHEST  2 VIEW  COMPARISON:  June 01, 2007.  FINDINGS: The heart size and mediastinal contours are within normal limits. No pneumothorax or pleural effusion is noted. Right lung is clear. Small linear density is noted laterally in left lung base most consistent with subsegmental atelectasis or scar. The visualized skeletal structures are unremarkable.  IMPRESSION: Small linear density seen laterally in left lung base most consistent with subsegmental atelectasis or scarring. No other  abnormality seen in the chest.   Electronically Signed   By: Sabino Dick M.D.   On: 08/13/2014 15:01   Dg Lumbar Spine 2-3 Views  08/14/2014   CLINICAL DATA:  L2-3 discectomy for disc herniation.  EXAM: LUMBAR SPINE - 2-3 VIEW  COMPARISON:  MRI from 07/23/2014.  FINDINGS: Two cross-table lateral portable views of lumbar spine are submitted after the procedure. Image labeled #1 was obtained at 1643 hrs. Using the same numbering scheme as on the previous MRI, a spinal needle overlies the posterior soft tissues and is positioned between the L2 and L3 spinous processes.  The second image obtained at 1653 hrs shows soft tissue retractors. A surgical probe is identified with the tip positioned just posterior to the L2-3 facets.  IMPRESSION: Intraoperative localization.   Electronically Signed   By: Misty Stanley M.D.   On: 08/14/2014 20:34   Mr Lumbar Spine Wo Contrast  07/23/2014   CLINICAL DATA:  Back pain greater on the right. Numbness medial and posterior thighs for the past 3 weeks. No known injury.  EXAM: MRI LUMBAR SPINE WITHOUT CONTRAST  TECHNIQUE: Multiplanar, multisequence MR imaging of the lumbar spine was performed. No intravenous contrast was administered.  COMPARISON:  No comparison MR.  Comparison abdominal CT 05/10/2014.  FINDINGS: Last fully open disk space is labeled L5-S1. Present examination incorporates from T11-12 disc space through the S2-S3 level.  Conus T12-L1 level.  Visualized paravertebral structures unremarkable.  Hemangioma/focal fatty deposit most notable L3.  T11-12 and  T12-L1 unremarkable.  L1-2: Minimal sclerotic endplate reactive changes inferior L1 endplate.  L2-3: Facet joint degenerative changes greater on the right. Minimal anterior slip L2. Disc degeneration with disc space narrowing. Baseline bulge slightly greater right foraminal position approaching but not causing significant compression of the exiting right L2 nerve root. Superimposed moderate size central/right  paracentral protrusion with slight cephalad and caudal extension with surrounding epidural blood suspected with elevation of the posterior longitudinal ligament causing significant compression of the thecal sac and nerve roots. Baseline mild spinal stenosis caused by slightly short pedicles and dorsal epidural fat.  L3-4: Bulge with left lateral and central extension. Left lateral aspect may have associated osteophyte causing mild mass effect upon the exiting left L3 nerve root. Centrally bulge causes mild impression upon the ventral aspect of the thecal sac.  L4-5: Disc degeneration with disc space narrowing with bulge/ osteophyte with foraminal/lateral extension greater to the right with slight encroachment upon the exiting right L4 nerve root. Facet joint degenerative changes. Very mild spinal stenosis.  L5-S1: Disc degeneration with bulge/ osteophyte with foraminal/ lateral extension approaching but not compressing the exiting L5 nerve roots. Central component insinuates between the S1 nerve roots which are not compressed. Mild facet joint degenerative changes.  IMPRESSION: Most notable finding on the present examination is the L2-3 disc protrusion causing significant spinal stenosis as detailed above.  Less notable degenerative changes L3-4 through L5-S1.  These results will be called to the ordering clinician or representative by the Radiologist Assistant, and communication documented in the PACS or zVision Dashboard.   Electronically Signed   By: Chauncey Cruel M.D.   On: 07/23/2014 08:54    Antibiotics:  Anti-infectives   Start     Dose/Rate Route Frequency Ordered Stop   08/15/14 1000  Ledipasvir-Sofosbuvir 90-400 MG TABS 1 tablet     1 tablet Oral Daily 08/14/14 2114     08/15/14 0000  ceFAZolin (ANCEF) IVPB 1 g/50 mL premix     1 g 100 mL/hr over 30 Minutes Intravenous Every 8 hours 08/14/14 2114 08/15/14 1559   08/14/14 1645  ceFAZolin (ANCEF) IVPB 2 g/50 mL premix     2 g 100 mL/hr over 30  Minutes Intravenous  Once 08/14/14 1640 08/14/14 1640   08/14/14 1637  bacitracin 50,000 Units in sodium chloride irrigation 0.9 % 500 mL irrigation  Status:  Discontinued       As needed 08/14/14 1638 08/14/14 1805      Discharge Exam: Blood pressure 125/86, pulse 94, temperature 97.9 F (36.6 C), temperature source Oral, resp. rate 18, height 5' 10.5" (1.791 m), weight 98.158 kg (216 lb 6.4 oz), SpO2 96.00%. Neurologic: Grossly normal incision cdi  Discharge Medications:     Medication List         amLODipine 5 MG tablet  Commonly known as:  NORVASC  Take 5 mg by mouth at bedtime.     HARVONI 90-400 MG Tabs  Generic drug:  Ledipasvir-Sofosbuvir  Take 1 tablet by mouth daily.     HM MEGAKRILL PO  Take 500 mg by mouth daily.     ibuprofen 200 MG tablet  Commonly known as:  ADVIL,MOTRIN  Take 800 mg by mouth every 6 (six) hours as needed (pain).     insulin glargine 100 unit/mL Sopn  Commonly known as:  LANTUS  Inject 50 Units into the skin at bedtime.     losartan-hydrochlorothiazide 100-25 MG per tablet  Commonly known as:  HYZAAR  Take 1 tablet  by mouth at bedtime.     methylphenidate 20 MG tablet  Commonly known as:  RITALIN  Take 20 mg by mouth daily.     multivitamin with minerals tablet  Take 1 tablet by mouth daily.     omeprazole 20 MG capsule  Commonly known as:  PRILOSEC  Take 20 mg by mouth daily.     oxyCODONE 5 MG immediate release tablet  Commonly known as:  Oxy IR/ROXICODONE  Take 1-2 tablets (5-10 mg total) by mouth every 4 (four) hours as needed for moderate pain or severe pain.        Disposition: home   Final Dx: DLL L2-3 with microdiskectomy      Discharge Instructions   Call MD for:  difficulty breathing, headache or visual disturbances    Complete by:  As directed      Call MD for:  persistant nausea and vomiting    Complete by:  As directed      Call MD for:  redness, tenderness, or signs of infection (pain, swelling,  redness, odor or green/yellow discharge around incision site)    Complete by:  As directed      Call MD for:  severe uncontrolled pain    Complete by:  As directed      Call MD for:  temperature >100.4    Complete by:  As directed      Diet - low sodium heart healthy    Complete by:  As directed      Discharge instructions    Complete by:  As directed   No strenuous activity, no driving, no bending or twisting     Increase activity slowly    Complete by:  As directed      Remove dressing in 48 hours    Complete by:  As directed            Follow-up Information   Follow up with Quierra Silverio S, MD. Schedule an appointment as soon as possible for a visit in 3 weeks.   Specialty:  Neurosurgery   Contact information:   [address] [place] [address] [phone]        Signed: Eustace Moore 08/15/2014, 7:28 AM

## 2014-08-15 NOTE — Progress Notes (Signed)
Patient alert and oriented, mae's well, voiding adequate amount of urine, swallowing without difficulty, no c/o pain. Patient discharged home with family. Script and discharged instructions given to patient. Patient and family stated understanding of instructions given.  

## 2014-08-16 ENCOUNTER — Encounter (HOSPITAL_COMMUNITY): Payer: Self-pay | Admitting: Neurological Surgery

## 2014-11-25 ENCOUNTER — Other Ambulatory Visit: Payer: Self-pay | Admitting: Nurse Practitioner

## 2014-11-25 DIAGNOSIS — K7469 Other cirrhosis of liver: Secondary | ICD-10-CM

## 2014-12-26 ENCOUNTER — Other Ambulatory Visit: Payer: Self-pay | Admitting: Nurse Practitioner

## 2014-12-26 ENCOUNTER — Ambulatory Visit
Admission: RE | Admit: 2014-12-26 | Discharge: 2014-12-26 | Disposition: A | Discharge status: Home / Self Care | Payer: 59 | Source: Ambulatory Visit | Attending: Nurse Practitioner | Admitting: Nurse Practitioner

## 2014-12-26 DIAGNOSIS — K7469 Other cirrhosis of liver: Secondary | ICD-10-CM

## 2015-04-28 ENCOUNTER — Other Ambulatory Visit: Payer: Self-pay | Admitting: Nurse Practitioner

## 2015-04-28 DIAGNOSIS — C220 Liver cell carcinoma: Secondary | ICD-10-CM

## 2015-05-03 IMAGING — CT CT ABDOMEN WO/W CM
4 of 9 series · 14 of 36 positions shown, 18 images · IV contrast (READICAT/WATER & [ID] OMNI 300)
Comparison: Abdominal ultrasound 07/19/2013

CLINICAL DATA: 56-year-old male with hepatitis C and elevated LFTs

CT ABDOMEN WITHOUT AND WITH CONTRAST
TECHNIQUE: Multidetector CT imaging of the abdomen was performed
following the standard protocol before and during bolus
administration of intravenous contrast.
Contrast: 100mL OMNIPAQUE IOHEXOL 300 MG/ML  SOLN

[Series 2: liver w/o · axial · non-contrast · 0.78mm/px · z∈[-205,-105]mm · 2 of 62 slices shown]
[im 21/62  soft-tissue]
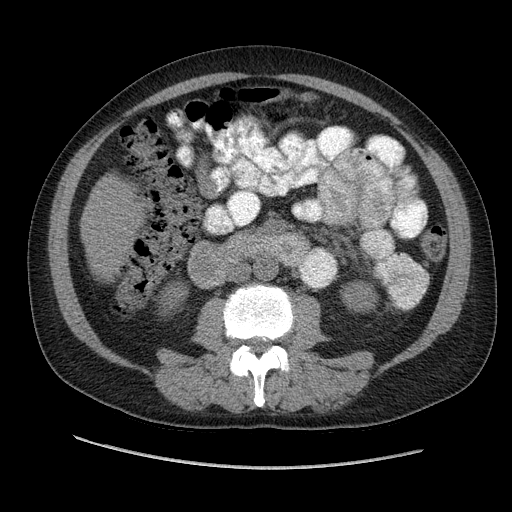
[im 41/62  soft-tissue]
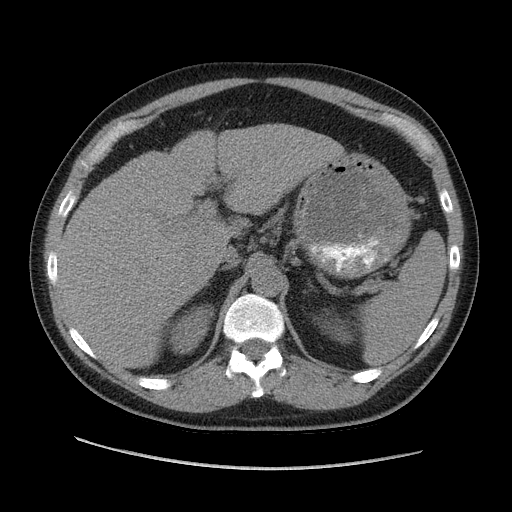

[Series 5: arterial · axial · arterial · 0.78mm/px · z∈[-255,-52]mm · 5 of 123 slices shown]
[im 21/123  soft-tissue]
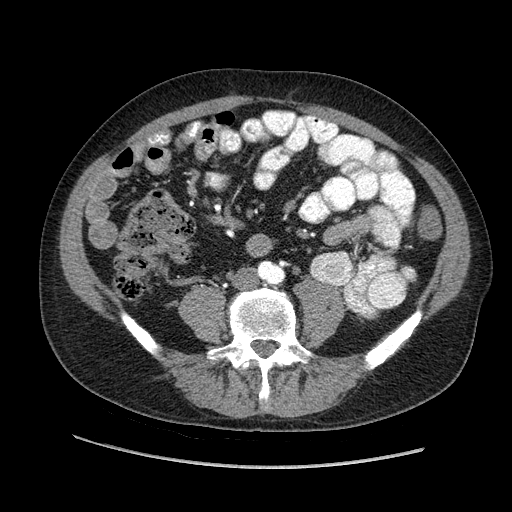
[im 41/123  soft-tissue]
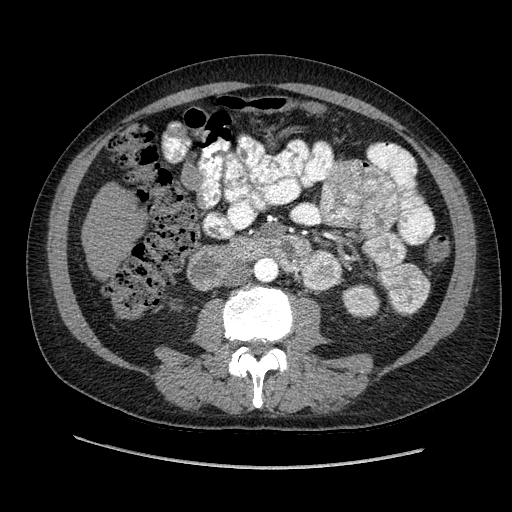
[im 62/123  soft-tissue]
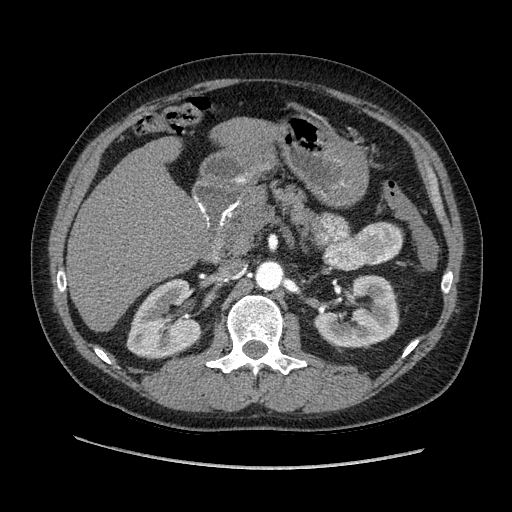
[im 82/123  soft-tissue]
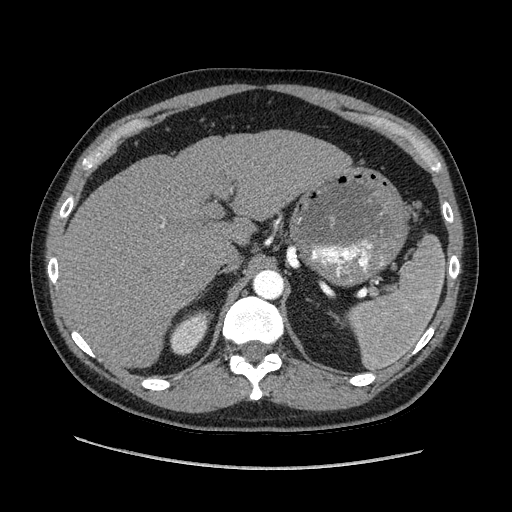
[im 102/123  soft-tissue]
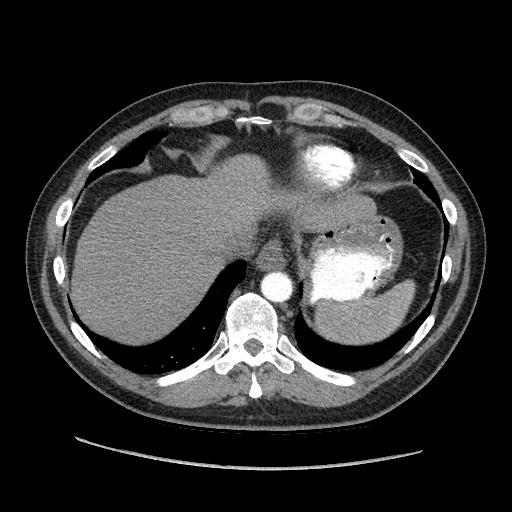

[Series 8: portal venous · axial · portal-venous · 0.78mm/px · z∈[-305,+0]mm · 3 of 62 slices shown, 7 images]
[im 1/62  soft-tissue]
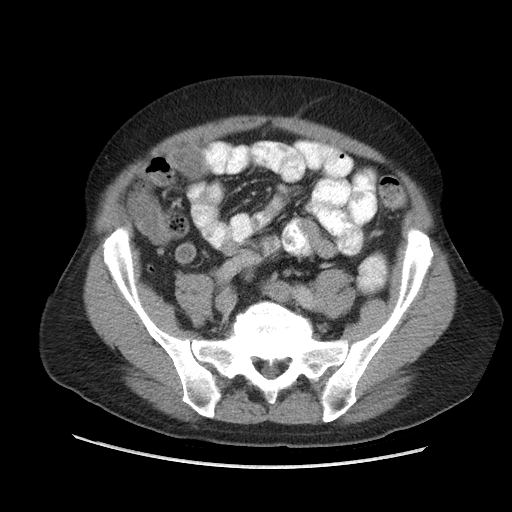
[im 1/62  lung]
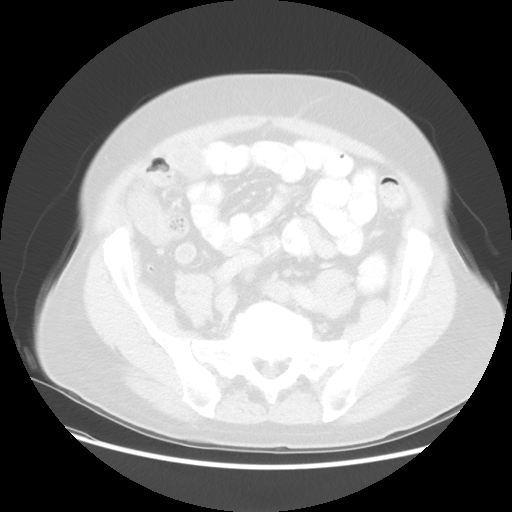
[im 1/62  bone]
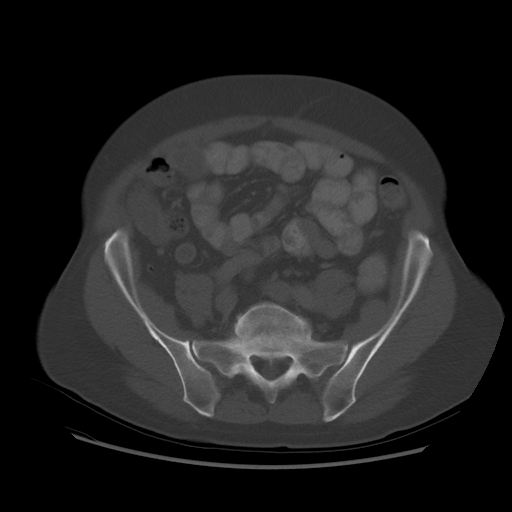
[im 31/62  soft-tissue]
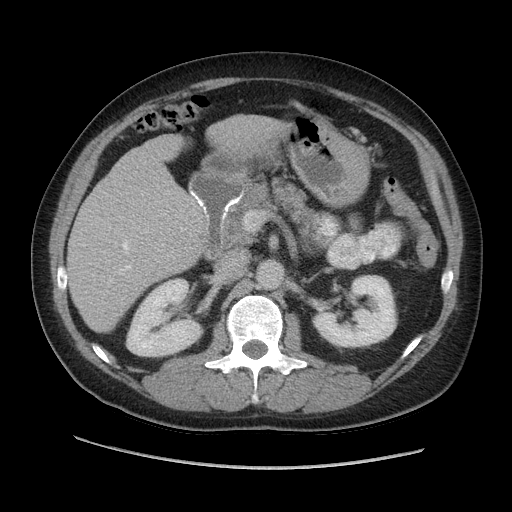
[im 31/62  lung]
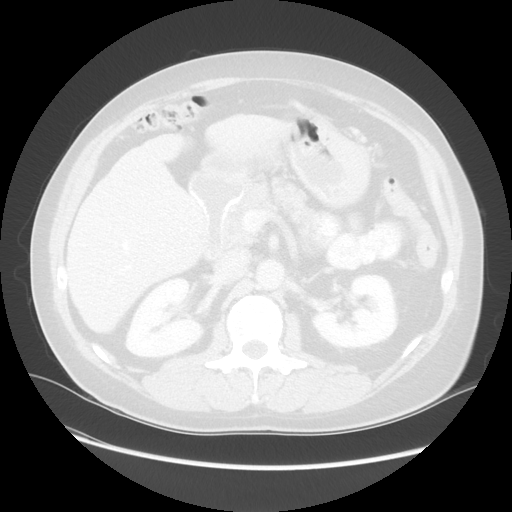
[im 62/62  soft-tissue]
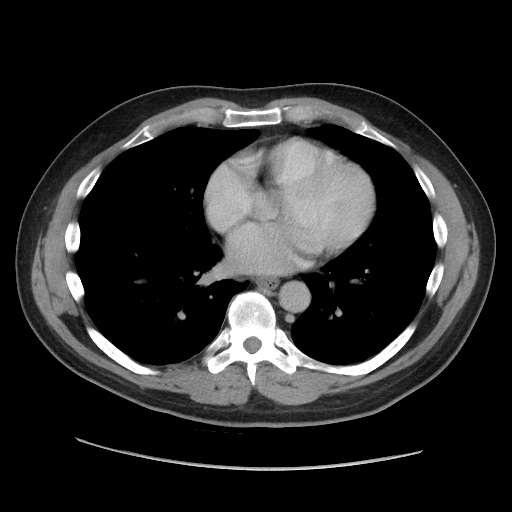
[im 62/62  lung]
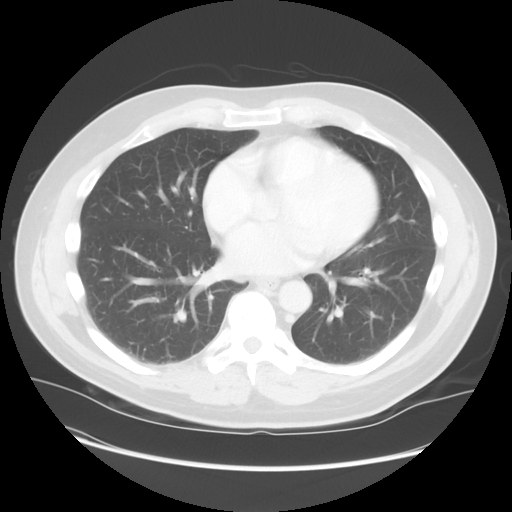

[Series 602: sagittal body · sagittal · 0.78mm/px · 4 of 155 slices shown]
[im 23/155  soft-tissue]
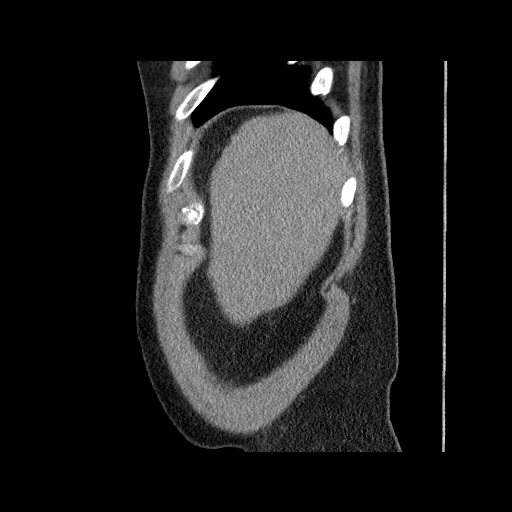
[im 45/155  soft-tissue]
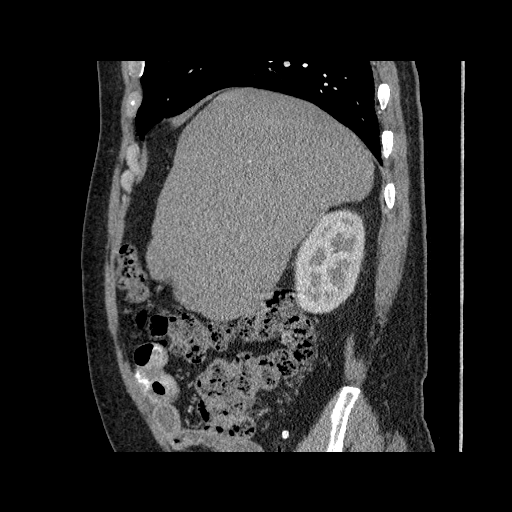
[im 67/155  soft-tissue]
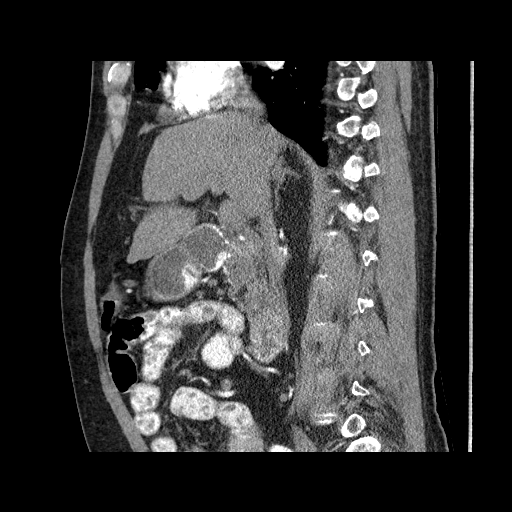
[im 89/155  soft-tissue]
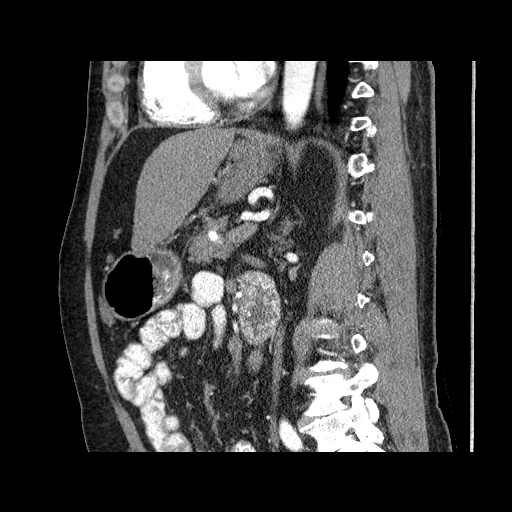

[14 of 36 positions shown; findings below may reference images not displayed]

FINDINGS: Lower Chest:  The visualized lung bases are clear.  Visualized
cardiac structures are within normal limits for size.  Slightly
patulous distal thoracic esophagus.  No significant wall
thickening.

Abdomen: Unremarkable stomach and duodenum.  No definite esophageal
varices.  The spleen is within normal limits for size.  No
splenomegaly.  Normal appearance of the adrenal glands and
pancreas.  A 1.3 cm low attenuation cystic structure in the upper
pole of the left kidney is most consistent with a simple cyst.
Symmetric parenchymal renal enhancement bilaterally.  No
hydronephrosis or nephrolithiasis.  No enhancing renal mass.

Nodular hepatic contour with blunting of the left hepatic edge the
portal vein is prominent at 2 cm in width.  No focal lesion on the
arterial or portal venous phase to suggest hepatic cellular
carcinoma.

Surgical changes of prior cholecystectomy.  No intra or
extrahepatic biliary ductal dilatation. Normal-caliber large and
small bowel throughout the abdomen.  No evidence of bowel
obstruction.  Incompletely imaged normal appendix in the right
lower quadrant.  No free fluid, ascites or suspicious adenopathy.

Bones: No acute fracture or aggressive appearing lytic or blastic
osseous lesion.  Lower lumbar degenerative disc disease.

Vascular:  The lateral segmental branch the left hepatic artery is
replaced to the left gastric artery.  The medial segmental branch
of the left gastric artery arises from the gastroduodenal artery.
The right hepatic artery is replaced to the superior mesenteric
artery.  There are three right renal arteries and a single left
renal artery.  Retroaortic left renal vein incidentally noted.  No
significant atherosclerotic vascular disease.
IMPRESSION: 1.  Cirrhotic morphology of the liver with a prominent/engorged
portal vein suggesting early portal hypertension.  No definite
esophageal or gastric varices, portosystemic shunt or splenomegaly
to confirm portal hypertension.

2. Negative for focal hepatic lesion.

3.  Variant hepatic arterial anatomy as detailed above.  This is of
no clinical significance unless the patient should ever require
hepatic directed transarterial therapy.

4.  Surgical changes of prior cholecystectomy.

5.  Additional ancillary findings as above.

[REDACTED]

## 2015-06-04 ENCOUNTER — Ambulatory Visit (HOSPITAL_COMMUNITY)
Admission: RE | Admit: 2015-06-04 | Discharge: 2015-06-04 | Disposition: A | Discharge status: Home / Self Care | Payer: 59 | Source: Ambulatory Visit | Attending: Nurse Practitioner | Admitting: Nurse Practitioner

## 2015-06-04 DIAGNOSIS — B1920 Unspecified viral hepatitis C without hepatic coma: Secondary | ICD-10-CM | POA: Insufficient documentation

## 2015-06-04 DIAGNOSIS — C220 Liver cell carcinoma: Secondary | ICD-10-CM

## 2015-06-04 DIAGNOSIS — Z9049 Acquired absence of other specified parts of digestive tract: Secondary | ICD-10-CM | POA: Insufficient documentation

## 2015-06-04 DIAGNOSIS — K7460 Unspecified cirrhosis of liver: Secondary | ICD-10-CM | POA: Insufficient documentation

## 2015-06-05 ENCOUNTER — Other Ambulatory Visit: Payer: 59

## 2015-10-23 ENCOUNTER — Other Ambulatory Visit: Payer: Self-pay | Admitting: Nurse Practitioner

## 2015-10-23 DIAGNOSIS — C220 Liver cell carcinoma: Secondary | ICD-10-CM

## 2016-01-01 MED FILL — LANTUS SOLOSTAR 100 UNITS/M: 100 | 27 days supply | Qty: 15 | Fill #2

## 2016-01-06 DIAGNOSIS — J309 Allergic rhinitis, unspecified: Secondary | ICD-10-CM | POA: Diagnosis not present

## 2016-01-06 DIAGNOSIS — E7800 Pure hypercholesterolemia, unspecified: Secondary | ICD-10-CM | POA: Diagnosis not present

## 2016-01-06 DIAGNOSIS — I10 Essential (primary) hypertension: Secondary | ICD-10-CM | POA: Diagnosis not present

## 2016-01-06 DIAGNOSIS — Z0000 Encounter for general adult medical examination without abnormal findings: Secondary | ICD-10-CM | POA: Diagnosis not present

## 2016-01-06 DIAGNOSIS — M158 Other polyosteoarthritis: Secondary | ICD-10-CM | POA: Diagnosis not present

## 2016-01-06 DIAGNOSIS — K219 Gastro-esophageal reflux disease without esophagitis: Secondary | ICD-10-CM | POA: Diagnosis not present

## 2016-01-06 DIAGNOSIS — F909 Attention-deficit hyperactivity disorder, unspecified type: Secondary | ICD-10-CM | POA: Diagnosis not present

## 2016-01-06 DIAGNOSIS — N521 Erectile dysfunction due to diseases classified elsewhere: Secondary | ICD-10-CM | POA: Diagnosis not present

## 2016-01-06 DIAGNOSIS — E1165 Type 2 diabetes mellitus with hyperglycemia: Secondary | ICD-10-CM | POA: Diagnosis not present

## 2016-01-06 MED FILL — METHYLPHENIDATE 20 MG TAB: 20 | 30 days supply | Qty: 30 | Fill #0

## 2016-01-15 MED FILL — AMLODIPINE BESYLATE 5 MG TA: 5 | 90 days supply | Qty: 90 | Fill #1

## 2016-01-30 MED FILL — LANTUS SOLOSTAR 100 UNITS/M: 100 | 27 days supply | Qty: 15 | Fill #3

## 2016-01-30 MED FILL — LOSARTAN-HCTZ 100-25 MG TAB: 100-25 | 90 days supply | Qty: 90 | Fill #0

## 2016-02-09 MED FILL — METHYLPHENIDATE 20 MG TAB: 20 | 30 days supply | Qty: 30 | Fill #0

## 2016-03-03 MED FILL — LANTUS SOLOSTAR 100 UNITS/M: 100 | 27 days supply | Qty: 15 | Fill #4

## 2016-03-17 MED FILL — PREVIDENT 5000 SENSITIVE PA: 1.1-5 | 30 days supply | Qty: 100 | Fill #1

## 2016-03-22 MED FILL — UNIFINE PENTIPS 8MM 31G: 31G X 8 MM | 90 days supply | Qty: 100 | Fill #0

## 2016-03-23 MED FILL — METHYLPHENIDATE 20 MG TAB: 20 | 30 days supply | Qty: 30 | Fill #0

## 2016-04-05 MED FILL — LANTUS SOLOSTAR 100 UNITS/M: 100 | 27 days supply | Qty: 15 | Fill #0

## 2016-04-15 MED FILL — AMLODIPINE BESYLATE 5 MG TA: 5 | 90 days supply | Qty: 90 | Fill #0

## 2016-04-21 ENCOUNTER — Other Ambulatory Visit: Payer: Self-pay | Admitting: Nurse Practitioner

## 2016-04-21 DIAGNOSIS — K7469 Other cirrhosis of liver: Secondary | ICD-10-CM | POA: Diagnosis not present

## 2016-04-28 MED FILL — LOSARTAN-HCTZ 100-25 MG TAB: 100-25 | 90 days supply | Qty: 90 | Fill #1

## 2016-04-28 MED FILL — LANTUS SOLOSTAR 100 UNITS/M: 100 | 27 days supply | Qty: 15 | Fill #1

## 2016-04-30 MED FILL — METHYLPHENIDATE 20 MG TAB: 20 | 30 days supply | Qty: 30 | Fill #0

## 2016-05-10 ENCOUNTER — Ambulatory Visit
Admission: RE | Admit: 2016-05-10 | Discharge: 2016-05-10 | Disposition: A | Discharge status: Home / Self Care | Payer: 59 | Source: Ambulatory Visit | Attending: Nurse Practitioner | Admitting: Nurse Practitioner

## 2016-05-10 DIAGNOSIS — K7460 Unspecified cirrhosis of liver: Secondary | ICD-10-CM | POA: Diagnosis not present

## 2016-05-10 DIAGNOSIS — K7469 Other cirrhosis of liver: Secondary | ICD-10-CM

## 2016-05-13 ENCOUNTER — Other Ambulatory Visit: Payer: 59

## 2016-05-31 MED FILL — LANTUS SOLOSTAR 100 UNITS/M: 100 | 27 days supply | Qty: 15 | Fill #2

## 2016-06-04 MED FILL — METHYLPHENIDATE 20 MG TAB: 20 | 30 days supply | Qty: 30 | Fill #0

## 2016-06-30 MED FILL — LANTUS SOLOSTAR 100 UNITS/M: 100 | 27 days supply | Qty: 15 | Fill #3

## 2016-06-30 MED FILL — UNIFINE PENTIPS 8MM 31G: 31G X 8 MM | 90 days supply | Qty: 100 | Fill #1

## 2016-07-15 MED FILL — AMLODIPINE BESYLATE 5 MG TA: 5 | 90 days supply | Qty: 90 | Fill #1

## 2016-07-21 DIAGNOSIS — N400 Enlarged prostate without lower urinary tract symptoms: Secondary | ICD-10-CM | POA: Diagnosis not present

## 2016-07-21 DIAGNOSIS — F909 Attention-deficit hyperactivity disorder, unspecified type: Secondary | ICD-10-CM | POA: Diagnosis not present

## 2016-07-21 DIAGNOSIS — G63 Polyneuropathy in diseases classified elsewhere: Secondary | ICD-10-CM | POA: Diagnosis not present

## 2016-07-21 DIAGNOSIS — E1165 Type 2 diabetes mellitus with hyperglycemia: Secondary | ICD-10-CM | POA: Diagnosis not present

## 2016-07-21 DIAGNOSIS — N521 Erectile dysfunction due to diseases classified elsewhere: Secondary | ICD-10-CM | POA: Diagnosis not present

## 2016-07-21 DIAGNOSIS — K219 Gastro-esophageal reflux disease without esophagitis: Secondary | ICD-10-CM | POA: Diagnosis not present

## 2016-07-21 DIAGNOSIS — I10 Essential (primary) hypertension: Secondary | ICD-10-CM | POA: Diagnosis not present

## 2016-07-21 DIAGNOSIS — M545 Low back pain: Secondary | ICD-10-CM | POA: Diagnosis not present

## 2016-07-21 MED FILL — METHYLPHENIDATE 20 MG TAB: 20 | 30 days supply | Qty: 30 | Fill #0

## 2016-08-03 MED FILL — LANTUS SOLOSTAR 100 UNITS/M: 100 | 27 days supply | Qty: 15 | Fill #4

## 2016-08-03 MED FILL — LOSARTAN-HCTZ 100-25 MG TAB: 100-25 | 90 days supply | Qty: 90 | Fill #0

## 2016-08-04 MED FILL — OMEPRAZOLE DR 20 MG CAPSULE: 20 | 90 days supply | Qty: 180 | Fill #0

## 2016-09-02 MED FILL — LANTUS SOLOSTAR 100 UNITS/M: 100 | 27 days supply | Qty: 15 | Fill #0

## 2016-09-02 MED FILL — METHYLPHENIDATE 20 MG TAB: 20 | 30 days supply | Qty: 30 | Fill #0

## 2016-09-16 DIAGNOSIS — R079 Chest pain, unspecified: Secondary | ICD-10-CM | POA: Diagnosis not present

## 2016-09-16 MED FILL — NITROGLYCERIN 0.4 MG TAB SL: 0.4 | 13 days supply | Qty: 25 | Fill #0

## 2016-09-20 ENCOUNTER — Other Ambulatory Visit: Payer: Self-pay | Admitting: Internal Medicine

## 2016-09-20 ENCOUNTER — Encounter (HOSPITAL_COMMUNITY): Payer: 59

## 2016-09-20 DIAGNOSIS — R079 Chest pain, unspecified: Secondary | ICD-10-CM

## 2016-09-23 ENCOUNTER — Encounter (HOSPITAL_COMMUNITY): Payer: 59

## 2016-09-29 ENCOUNTER — Encounter: Payer: Self-pay | Admitting: Internal Medicine

## 2016-09-30 ENCOUNTER — Ambulatory Visit (INDEPENDENT_AMBULATORY_CARE_PROVIDER_SITE_OTHER): Payer: 59

## 2016-09-30 ENCOUNTER — Other Ambulatory Visit: Payer: 59 | Admitting: *Deleted

## 2016-09-30 ENCOUNTER — Ambulatory Visit (INDEPENDENT_AMBULATORY_CARE_PROVIDER_SITE_OTHER): Payer: 59 | Admitting: Cardiology

## 2016-09-30 ENCOUNTER — Encounter: Payer: Self-pay | Admitting: Cardiology

## 2016-09-30 VITALS — BP 132/84 | HR 82 | Ht 70.5 in | Wt 224.0 lb

## 2016-09-30 DIAGNOSIS — E784 Other hyperlipidemia: Secondary | ICD-10-CM | POA: Diagnosis not present

## 2016-09-30 DIAGNOSIS — I208 Other forms of angina pectoris: Secondary | ICD-10-CM

## 2016-09-30 DIAGNOSIS — E7849 Other hyperlipidemia: Secondary | ICD-10-CM

## 2016-09-30 DIAGNOSIS — R9439 Abnormal result of other cardiovascular function study: Secondary | ICD-10-CM | POA: Diagnosis not present

## 2016-09-30 DIAGNOSIS — I10 Essential (primary) hypertension: Secondary | ICD-10-CM | POA: Diagnosis not present

## 2016-09-30 DIAGNOSIS — R079 Chest pain, unspecified: Secondary | ICD-10-CM

## 2016-09-30 LAB — EXERCISE TOLERANCE TEST
Estimated workload: 7 METS
Exercise duration (min): 5 min
Exercise duration (sec): 48 s
MPHR: 159 {beats}/min
Peak HR: 131 {beats}/min
Percent HR: 83 %
Percent of predicted max HR: 82 %
RPE: 13
Rest HR: 72 {beats}/min
Stage 1 DBP: 82 mmHg
Stage 1 Grade: 0 %
Stage 1 HR: 82 {beats}/min
Stage 1 SBP: 133 mmHg
Stage 1 Speed: 0 mph
Stage 2 Grade: 0 %
Stage 2 HR: 79 {beats}/min
Stage 2 Speed: 1 mph
Stage 3 Grade: 0.1 %
Stage 3 HR: 80 {beats}/min
Stage 3 Speed: 1 mph
Stage 4 DBP: 87 mmHg
Stage 4 Grade: 10 %
Stage 4 HR: 112 {beats}/min
Stage 4 SBP: 186 mmHg
Stage 4 Speed: 1.7 mph
Stage 5 Grade: 12 %
Stage 5 HR: 131 {beats}/min
Stage 5 Speed: 2.5 mph
Stage 6 DBP: 81 mmHg
Stage 6 Grade: 0 %
Stage 6 HR: 106 {beats}/min
Stage 6 SBP: 120 mmHg
Stage 6 Speed: 0 mph
Stage 7 DBP: 95 mmHg
Stage 7 Grade: 0 %
Stage 7 HR: 85 {beats}/min
Stage 7 SBP: 195 mmHg
Stage 7 Speed: 0 mph

## 2016-09-30 LAB — CBC WITH DIFFERENTIAL/PLATELET
Basophils Absolute: 80 cells/uL (ref 0–200)
Basophils Relative: 1 %
Eosinophils Absolute: 240 cells/uL (ref 15–500)
Eosinophils Relative: 3 %
HCT: 43.7 % (ref 38.5–50.0)
Hemoglobin: 14.9 g/dL (ref 13.2–17.1)
Lymphocytes Relative: 26 %
Lymphs Abs: 2080 cells/uL (ref 850–3900)
MCH: 30.8 pg (ref 27.0–33.0)
MCHC: 34.1 g/dL (ref 32.0–36.0)
MCV: 90.3 fL (ref 80.0–100.0)
MPV: 12.3 fL (ref 7.5–12.5)
Monocytes Absolute: 720 cells/uL (ref 200–950)
Monocytes Relative: 9 %
Neutro Abs: 4880 cells/uL (ref 1500–7800)
Neutrophils Relative %: 61 %
Platelets: 216 10*3/uL (ref 140–400)
RBC: 4.84 MIL/uL (ref 4.20–5.80)
RDW: 13 % (ref 11.0–15.0)
WBC: 8 10*3/uL (ref 3.8–10.8)

## 2016-09-30 LAB — COMPREHENSIVE METABOLIC PANEL
ALT: 37 U/L (ref 9–46)
AST: 26 U/L (ref 10–35)
Albumin: 4.5 g/dL (ref 3.6–5.1)
Alkaline Phosphatase: 62 U/L (ref 40–115)
BUN: 20 mg/dL (ref 7–25)
CO2: 28 mmol/L (ref 20–31)
Calcium: 9.9 mg/dL (ref 8.6–10.3)
Chloride: 100 mmol/L (ref 98–110)
Creat: 0.92 mg/dL (ref 0.70–1.25)
Glucose, Bld: 119 mg/dL — ABNORMAL HIGH (ref 65–99)
Potassium: 4 mmol/L (ref 3.5–5.3)
Sodium: 138 mmol/L (ref 135–146)
Total Bilirubin: 1 mg/dL (ref 0.2–1.2)
Total Protein: 7 g/dL (ref 6.1–8.1)

## 2016-09-30 MED ORDER — METOPROLOL SUCCINATE ER 25 MG PO TB24: 25.0000 mg | ORAL_TABLET | Freq: Every day | ORAL | 3 refills | Status: DC

## 2016-09-30 MED FILL — METOPROLOL SUCC ER 25 MG TA: 25 | 90 days supply | Qty: 90 | Fill #0

## 2016-09-30 NOTE — Progress Notes (Signed)
Referring: Dr Inda Merlin  HPI: 60 yo male with PMH of DM, HTN, hyperlipidemia and cirrhosis for evaluation of new onset angina. Over past 3 months, pt complains of CP with riding bike, climbing stairs or hills, relieved with rest; associated dyspnea and diaphoresis; radiates to shoulders bilaterally; described as a tightness. Had ETT today in office with ST depression in recovery consistent with abnormal ETT. Note no CP at rest. Denies DOE, orthopnea, PND or pedal edema. Because of the above, we were asked to evaluate (patient seen as add on following abnormal ETT).  Current Outpatient Prescriptions  Medication Sig Dispense Refill  . amLODipine (NORVASC) 5 MG tablet Take 5 mg by mouth at bedtime.     Marland Kitchen aspirin EC 81 MG tablet Take 81 mg by mouth daily.    Marland Kitchen ibuprofen (ADVIL,MOTRIN) 200 MG tablet Take 800 mg by mouth every 6 (six) hours as needed (pain).    . insulin glargine (LANTUS) 100 unit/mL SOPN Inject 46 Units into the skin at bedtime.     Javier Docker Oil (HM MEGAKRILL PO) Take 500 mg by mouth daily.    Marland Kitchen losartan-hydrochlorothiazide (HYZAAR) 100-25 MG per tablet Take 1 tablet by mouth at bedtime.     . methylphenidate (RITALIN) 20 MG tablet Take 20 mg by mouth daily as needed (Use as directed).     . Multiple Vitamins-Minerals (MULTIVITAMIN WITH MINERALS) tablet Take 1 tablet by mouth daily.    Marland Kitchen omeprazole (PRILOSEC) 20 MG capsule Take 20 mg by mouth daily.     No current facility-administered medications for this visit.     No Known Allergies   Past Medical History:  Diagnosis Date  . Arthritis   . Asthma    as a child  . Bronchitis   . Cancer ([place])    basal skin ca  . Cirrhosis ([place])   . Diabetes mellitus type II   . GERD (gastroesophageal reflux disease)   . Hepatitis C   . Hyperlipidemia   . Hypertension     Past Surgical History:  Procedure Laterality Date  . ACROMIO-CLAVICULAR JOINT REPAIR    . CARDIAC CATHETERIZATION  2005   clear results.  . CHOLECYSTECTOMY    .  COLONOSCOPY    . JOINT REPLACEMENT    . KNEE SURGERY    . LUMBAR LAMINECTOMY/DECOMPRESSION MICRODISCECTOMY Right 08/14/2014   Procedure: LUMBAR LAMINECTOMY/DECOMPRESSION MICRODISCECTOMY LUMBAR TWO-THREE;  Surgeon: Eustace Moore, MD;  Location: [facility];  Service: Neurosurgery;  Laterality: Right;  . ROTATOR CUFF REPAIR    . TONSILLECTOMY      Social History   Social History  . Marital status: Divorced    Spouse name: N/A  . Number of children: 1  . Years of education: N/A   Occupational History  . Not on file.   Social History Main Topics  . Smoking status: Never Smoker  . Smokeless tobacco: Former Systems developer    Quit date: 03/13/1999  . Alcohol use Yes     Comment: Occasional  . Drug use: No  . Sexual activity: Not on file   Other Topics Concern  . Not on file   Social History Narrative  . No narrative on file    Family History  Problem Relation Age of Onset  . Hypertension Mother     ROS: no fevers or chills, productive cough, hemoptysis, dysphasia, odynophagia, melena, hematochezia, dysuria, hematuria, rash, seizure activity, orthopnea, PND, pedal edema, claudication. Remaining systems are negative.  Physical Exam:   Blood pressure  132/84, pulse 82, height 5' 10.5" (1.791 m), weight 224 lb (101.6 kg).  General:  Well developed/well nourished in NAD Skin warm/dry Patient not depressed No peripheral clubbing Back-normal HEENT-normal/normal eyelids Neck supple/normal carotid upstroke bilaterally; no bruits; no JVD; no thyromegaly chest - CTA/ normal expansion CV - RRR/normal S1 and S2; no murmurs, rubs or gallops;  PMI nondisplaced Abdomen -NT/ND, no HSM, no mass, + bowel sounds, no bruit 2+ femoral pulses, no bruits Ext-no edema, chords, 2+ DP Neuro-grossly nonfocal  ECG -NSR with no ST changes  ETT with ST depression in recovery-abnormal ETT  1 New onset angina-patient's symptoms are concerning and ETT abnormal. Plan to continue ASA and add toprol 25 mg  daily; proceed with cardiac catheterization; risks and benefits discussed and pt agrees to proceed (myocardial infarction, CVA and death). Note no symptoms at rest. Cath scheduled with Dr Tamala Julian for tomorrow.  2 HTN - BP controlled; continue present meds  3 Hyperlipidemia-if CAD noted on cath, would add statin but follow LFTs closely given h/o cirrhosis.  4 DM  [name], MD

## 2016-09-30 NOTE — Patient Instructions (Addendum)
Medication Instructions:  Your physician has recommended you make the following change in your medication:  1) Start Toprol XL 25 mg daily   Labwork: Your physician recommends that you return for lab work today: CMET/CBC/PT/INR   Testing/Procedures:  Your physician has requested that you have a cardiac catheterization. Cardiac catheterization is used to diagnose and/or treat various heart conditions. Doctors may recommend this procedure for a number of different reasons. The most common reason is to evaluate chest pain. Chest pain can be a symptom of coronary artery disease (CAD), and cardiac catheterization can show whether plaque is narrowing or blocking your heart's arteries. This procedure is also used to evaluate the valves, as well as measure the blood flow and oxygen levels in different parts of your heart. For further information please visit [email or web address]. Please follow instruction sheet, as given.--10/01/16  .   Please arrive at the [facility] at 11:30am Do not eat or drink after midnight the prior to the procedure Okay to take your medications with a small sip of water No Insulin tomorrow morning Plan for one night stay Will need someone to drive you home after procedure    Follow-Up: Your physician recommends that you schedule a follow-up appointment in: 6 weeks with Dr Stanford Breed at our [facility] office   Any Other Special Instructions Will Be Listed Below (If Applicable).     If you need a refill on your cardiac medications before your next appointment, please call your pharmacy.

## 2016-10-01 ENCOUNTER — Encounter (HOSPITAL_COMMUNITY)
Admission: RE | Disposition: A | Discharge status: Home / Self Care | Payer: Self-pay | Source: Ambulatory Visit | Attending: Interventional Cardiology

## 2016-10-01 ENCOUNTER — Encounter (HOSPITAL_COMMUNITY): Payer: Self-pay | Admitting: General Practice

## 2016-10-01 ENCOUNTER — Other Ambulatory Visit: Payer: Self-pay

## 2016-10-01 ENCOUNTER — Ambulatory Visit (HOSPITAL_COMMUNITY)
Admission: RE | Admit: 2016-10-01 | Discharge: 2016-10-02 | Disposition: A | Discharge status: Home / Self Care | Payer: 59 | Source: Ambulatory Visit | Attending: Interventional Cardiology | Admitting: Interventional Cardiology

## 2016-10-01 DIAGNOSIS — M1990 Unspecified osteoarthritis, unspecified site: Secondary | ICD-10-CM | POA: Insufficient documentation

## 2016-10-01 DIAGNOSIS — J45909 Unspecified asthma, uncomplicated: Secondary | ICD-10-CM | POA: Diagnosis not present

## 2016-10-01 DIAGNOSIS — Z7982 Long term (current) use of aspirin: Secondary | ICD-10-CM | POA: Insufficient documentation

## 2016-10-01 DIAGNOSIS — K7460 Unspecified cirrhosis of liver: Secondary | ICD-10-CM | POA: Insufficient documentation

## 2016-10-01 DIAGNOSIS — I10 Essential (primary) hypertension: Secondary | ICD-10-CM | POA: Diagnosis not present

## 2016-10-01 DIAGNOSIS — E785 Hyperlipidemia, unspecified: Secondary | ICD-10-CM | POA: Diagnosis present

## 2016-10-01 DIAGNOSIS — Z79899 Other long term (current) drug therapy: Secondary | ICD-10-CM | POA: Diagnosis not present

## 2016-10-01 DIAGNOSIS — Z006 Encounter for examination for normal comparison and control in clinical research program: Secondary | ICD-10-CM

## 2016-10-01 DIAGNOSIS — Z794 Long term (current) use of insulin: Secondary | ICD-10-CM | POA: Diagnosis not present

## 2016-10-01 DIAGNOSIS — IMO0001 Reserved for inherently not codable concepts without codable children: Secondary | ICD-10-CM

## 2016-10-01 DIAGNOSIS — K219 Gastro-esophageal reflux disease without esophagitis: Secondary | ICD-10-CM | POA: Insufficient documentation

## 2016-10-01 DIAGNOSIS — I209 Angina pectoris, unspecified: Secondary | ICD-10-CM | POA: Diagnosis present

## 2016-10-01 DIAGNOSIS — E119 Type 2 diabetes mellitus without complications: Secondary | ICD-10-CM | POA: Insufficient documentation

## 2016-10-01 DIAGNOSIS — Z87891 Personal history of nicotine dependence: Secondary | ICD-10-CM | POA: Diagnosis not present

## 2016-10-01 DIAGNOSIS — B1920 Unspecified viral hepatitis C without hepatic coma: Secondary | ICD-10-CM | POA: Insufficient documentation

## 2016-10-01 DIAGNOSIS — Z95820 Peripheral vascular angioplasty status with implants and grafts: Secondary | ICD-10-CM

## 2016-10-01 DIAGNOSIS — I25118 Atherosclerotic heart disease of native coronary artery with other forms of angina pectoris: Secondary | ICD-10-CM | POA: Insufficient documentation

## 2016-10-01 DIAGNOSIS — R9439 Abnormal result of other cardiovascular function study: Secondary | ICD-10-CM | POA: Diagnosis present

## 2016-10-01 DIAGNOSIS — I25119 Atherosclerotic heart disease of native coronary artery with unspecified angina pectoris: Secondary | ICD-10-CM | POA: Diagnosis present

## 2016-10-01 HISTORY — PX: CORONARY STENT PLACEMENT: SHX1402

## 2016-10-01 HISTORY — DX: Angina pectoris, unspecified: I20.9

## 2016-10-01 HISTORY — PX: CARDIAC CATHETERIZATION: SHX172

## 2016-10-01 HISTORY — DX: Encounter for examination for normal comparison and control in clinical research program: Z00.6

## 2016-10-01 HISTORY — DX: Abnormal result of other cardiovascular function study: R94.39

## 2016-10-01 HISTORY — DX: Peripheral vascular angioplasty status with implants and grafts: Z95.820

## 2016-10-01 HISTORY — DX: Atherosclerotic heart disease of native coronary artery without angina pectoris: I25.10

## 2016-10-01 HISTORY — DX: Hyperlipidemia, unspecified: E78.5

## 2016-10-01 LAB — CBC
HCT: 42.3 % (ref 39.0–52.0)
Hemoglobin: 14.3 g/dL (ref 13.0–17.0)
MCH: 30.4 pg (ref 26.0–34.0)
MCHC: 33.8 g/dL (ref 30.0–36.0)
MCV: 90 fL (ref 78.0–100.0)
Platelets: 195 10*3/uL (ref 150–400)
RBC: 4.7 MIL/uL (ref 4.22–5.81)
RDW: 12.6 % (ref 11.5–15.5)
WBC: 8.4 10*3/uL (ref 4.0–10.5)

## 2016-10-01 LAB — CREATININE, SERUM
Creatinine, Ser: 1.02 mg/dL (ref 0.61–1.24)
GFR calc Af Amer: >60 mL/min (ref >60)
GFR calc non Af Amer: >60 mL/min (ref >60)

## 2016-10-01 LAB — POCT ACTIVATED CLOTTING TIME
Activated Clotting Time: 720 seconds
Activated Clotting Time: 219 seconds
Activated Clotting Time: 367 seconds

## 2016-10-01 LAB — GLUCOSE, CAPILLARY
Glucose-Capillary: 97 mg/dL (ref 65–99)
Glucose-Capillary: 73 mg/dL (ref 65–99)
Glucose-Capillary: 126 mg/dL — ABNORMAL HIGH (ref 65–99)

## 2016-10-01 LAB — PROTIME-INR
INR: 1
Prothrombin Time: 11.1 s (ref 9.0–11.5)

## 2016-10-01 SURGERY — LEFT HEART CATH AND CORONARY ANGIOGRAPHY
Anesthesia: LOCAL

## 2016-10-01 MED ORDER — FENTANYL CITRATE (PF) 100 MCG/2ML IJ SOLN
INTRAMUSCULAR | Status: AC
  Filled 2016-10-01: qty 2

## 2016-10-01 MED ORDER — IOPAMIDOL (ISOVUE-370) INJECTION 76%
INTRAVENOUS | Status: AC
  Filled 2016-10-01: qty 100

## 2016-10-01 MED ORDER — NITROGLYCERIN 1 MG/10 ML FOR IR/CATH LAB
INTRA_ARTERIAL | Status: DC | PRN
  Administered 2016-10-01: 200 ug via INTRACORONARY

## 2016-10-01 MED ORDER — METOPROLOL SUCCINATE ER 25 MG PO TB24
25.0000 mg | ORAL_TABLET | Freq: Every day | ORAL | Status: DC
  Administered 2016-10-02: 09:00:00 25 mg via ORAL
  Filled 2016-10-01: qty 1

## 2016-10-01 MED ORDER — SODIUM CHLORIDE 0.9 % IV SOLN: 250.0000 mL | INTRAVENOUS | Status: DC | PRN

## 2016-10-01 MED ORDER — HEPARIN (PORCINE) IN NACL 2-0.9 UNIT/ML-% IJ SOLN
INTRAMUSCULAR | Status: AC
  Filled 2016-10-01: qty 1500

## 2016-10-01 MED ORDER — VERAPAMIL HCL 2.5 MG/ML IV SOLN
INTRAVENOUS | Status: AC
  Filled 2016-10-01: qty 2

## 2016-10-01 MED ORDER — HEPARIN SODIUM (PORCINE) 1000 UNIT/ML IJ SOLN
INTRAMUSCULAR | Status: AC
  Filled 2016-10-01: qty 1

## 2016-10-01 MED ORDER — SODIUM CHLORIDE 0.9 % WEIGHT BASED INFUSION
3.0000 mL/kg/h | INTRAVENOUS | Status: DC
  Administered 2016-10-01: 3 mL/kg/h via INTRAVENOUS

## 2016-10-01 MED ORDER — ASPIRIN 81 MG PO CHEW: 81.0000 mg | CHEWABLE_TABLET | ORAL | Status: DC

## 2016-10-01 MED ORDER — LIDOCAINE HCL (PF) 1 % IJ SOLN
INTRAMUSCULAR | Status: DC | PRN
  Administered 2016-10-01: 2 mL via INTRADERMAL

## 2016-10-01 MED ORDER — ADENOSINE 12 MG/4ML IV SOLN
INTRAVENOUS | Status: AC
  Filled 2016-10-01: qty 4

## 2016-10-01 MED ORDER — ASPIRIN 81 MG PO CHEW
81.0000 mg | CHEWABLE_TABLET | Freq: Every day | ORAL | Status: DC
  Administered 2016-10-02: 81 mg via ORAL
  Filled 2016-10-01: qty 1

## 2016-10-01 MED ORDER — SODIUM CHLORIDE 0.9 % WEIGHT BASED INFUSION
1.0000 mL/kg/h | INTRAVENOUS | Status: AC
  Administered 2016-10-01: 1 mL/kg/h via INTRAVENOUS

## 2016-10-01 MED ORDER — INSULIN GLARGINE 100 UNITS/ML SOLOSTAR PEN
46.0000 [IU] | PEN_INJECTOR | Freq: Every day | SUBCUTANEOUS | Status: DC
  Filled 2016-10-01: qty 3

## 2016-10-01 MED ORDER — TICAGRELOR 90 MG PO TABS
ORAL_TABLET | ORAL | Status: AC
  Filled 2016-10-01: qty 2

## 2016-10-01 MED ORDER — HEPARIN SODIUM (PORCINE) 1000 UNIT/ML IJ SOLN
INTRAMUSCULAR | Status: DC | PRN
  Administered 2016-10-01: 3500 [IU] via INTRAVENOUS
  Administered 2016-10-01: 5000 [IU] via INTRAVENOUS
  Administered 2016-10-01: 3000 [IU] via INTRAVENOUS

## 2016-10-01 MED ORDER — AMLODIPINE BESYLATE 5 MG PO TABS
5.0000 mg | ORAL_TABLET | Freq: Every day | ORAL | Status: DC
  Administered 2016-10-02: 09:00:00 5 mg via ORAL
  Filled 2016-10-01: qty 1

## 2016-10-01 MED ORDER — ADENOSINE (DIAGNOSTIC) 140MCG/KG/MIN
INTRAVENOUS | Status: DC | PRN
  Administered 2016-10-01: 140 ug/kg/min via INTRAVENOUS

## 2016-10-01 MED ORDER — HEPARIN (PORCINE) IN NACL 2-0.9 UNIT/ML-% IJ SOLN
INTRAMUSCULAR | Status: DC | PRN
  Administered 2016-10-01: 1000 mL via INTRA_ARTERIAL

## 2016-10-01 MED ORDER — SODIUM CHLORIDE 0.9% FLUSH: 3.0000 mL | INTRAVENOUS | Status: DC | PRN

## 2016-10-01 MED ORDER — TICAGRELOR 90 MG PO TABS
ORAL_TABLET | ORAL | Status: DC | PRN
  Administered 2016-10-01: 180 mg via ORAL

## 2016-10-01 MED ORDER — HYDROCHLOROTHIAZIDE 25 MG PO TABS
25.0000 mg | ORAL_TABLET | Freq: Every day | ORAL | Status: DC
  Administered 2016-10-02: 09:00:00 25 mg via ORAL
  Filled 2016-10-01: qty 1

## 2016-10-01 MED ORDER — FENTANYL CITRATE (PF) 100 MCG/2ML IJ SOLN
INTRAMUSCULAR | Status: DC | PRN
  Administered 2016-10-01: 50 ug via INTRAVENOUS
  Administered 2016-10-01 (×2): 25 ug via INTRAVENOUS

## 2016-10-01 MED ORDER — SODIUM CHLORIDE 0.9% FLUSH
3.0000 mL | Freq: Two times a day (BID) | INTRAVENOUS | Status: DC
  Administered 2016-10-01: 21:00:00 3 mL via INTRAVENOUS

## 2016-10-01 MED ORDER — ATORVASTATIN CALCIUM 80 MG PO TABS
80.0000 mg | ORAL_TABLET | Freq: Every day | ORAL | Status: DC
  Administered 2016-10-01: 80 mg via ORAL
  Filled 2016-10-01: qty 1

## 2016-10-01 MED ORDER — IOPAMIDOL (ISOVUE-370) INJECTION 76%
INTRAVENOUS | Status: DC | PRN
  Administered 2016-10-01: 190 mL via INTRA_ARTERIAL

## 2016-10-01 MED ORDER — SODIUM CHLORIDE 0.9 % WEIGHT BASED INFUSION: 1.0000 mL/kg/h | INTRAVENOUS | Status: DC

## 2016-10-01 MED ORDER — MIDAZOLAM HCL 2 MG/2ML IJ SOLN
INTRAMUSCULAR | Status: AC
  Filled 2016-10-01: qty 2

## 2016-10-01 MED ORDER — LIDOCAINE HCL (PF) 1 % IJ SOLN
INTRAMUSCULAR | Status: AC
  Filled 2016-10-01: qty 30

## 2016-10-01 MED ORDER — INSULIN ASPART 100 UNIT/ML ~~LOC~~ SOLN: 0.0000 [IU] | Freq: Three times a day (TID) | SUBCUTANEOUS | Status: DC

## 2016-10-01 MED ORDER — SODIUM CHLORIDE 0.9% FLUSH: 3.0000 mL | Freq: Two times a day (BID) | INTRAVENOUS | Status: DC

## 2016-10-01 MED ORDER — ONDANSETRON HCL 4 MG/2ML IJ SOLN: 4.0000 mg | Freq: Four times a day (QID) | INTRAMUSCULAR | Status: DC | PRN

## 2016-10-01 MED ORDER — INSULIN GLARGINE 100 UNIT/ML ~~LOC~~ SOLN
46.0000 [IU] | Freq: Every day | SUBCUTANEOUS | Status: DC
Start: 2016-10-01 — End: 2016-10-02
  Administered 2016-10-01: 23:00:00 46 [IU] via SUBCUTANEOUS
  Filled 2016-10-01 (×2): qty 0.46

## 2016-10-01 MED ORDER — HEPARIN SODIUM (PORCINE) 5000 UNIT/ML IJ SOLN: 5000.0000 [IU] | Freq: Three times a day (TID) | INTRAMUSCULAR | Status: DC

## 2016-10-01 MED ORDER — HEPARIN (PORCINE) IN NACL 2-0.9 UNIT/ML-% IJ SOLN
INTRAMUSCULAR | Status: DC | PRN
  Administered 2016-10-01: 14:00:00 via INTRA_ARTERIAL

## 2016-10-01 MED ORDER — NITROGLYCERIN 1 MG/10 ML FOR IR/CATH LAB
INTRA_ARTERIAL | Status: AC
  Filled 2016-10-01: qty 10

## 2016-10-01 MED ORDER — ANGIOPLASTY BOOK
Freq: Once | Status: AC
  Administered 2016-10-01: 21:00:00
  Filled 2016-10-01: qty 1

## 2016-10-01 MED ORDER — ACETAMINOPHEN 325 MG PO TABS: 650.0000 mg | ORAL_TABLET | ORAL | Status: DC | PRN

## 2016-10-01 MED ORDER — MIDAZOLAM HCL 2 MG/2ML IJ SOLN
INTRAMUSCULAR | Status: DC | PRN
  Administered 2016-10-01 (×4): 1 mg via INTRAVENOUS

## 2016-10-01 MED ORDER — LOSARTAN POTASSIUM 50 MG PO TABS
100.0000 mg | ORAL_TABLET | Freq: Every day | ORAL | Status: DC
  Administered 2016-10-02: 09:00:00 100 mg via ORAL
  Filled 2016-10-01: qty 2

## 2016-10-01 MED ORDER — LOSARTAN POTASSIUM-HCTZ 100-25 MG PO TABS: 1.0000 | ORAL_TABLET | Freq: Every day | ORAL | Status: DC

## 2016-10-01 MED ORDER — TICAGRELOR 90 MG PO TABS
90.0000 mg | ORAL_TABLET | Freq: Two times a day (BID) | ORAL | Status: DC
  Administered 2016-10-02 (×2): 90 mg via ORAL
  Filled 2016-10-01 (×2): qty 1

## 2016-10-01 SURGICAL SUPPLY — 17 items
BALLN EMERGE MR 3.0X12 (BALLOONS) ×2
BALLN ~~LOC~~ TREK RX 3.25X12 (BALLOONS) ×2
BALLOON EMERGE MR 3.0X12 (BALLOONS) ×1 IMPLANT
BALLOON ~~LOC~~ TREK RX 3.25X12 (BALLOONS) ×1 IMPLANT
CATH 5FR JL3.5 JR4 ANG PIG MP (CATHETERS) ×2 IMPLANT
CATH VISTA GUIDE 6FR XBLAD3.5 (CATHETERS) ×2 IMPLANT
DEVICE RAD COMP TR BAND LRG (VASCULAR PRODUCTS) ×2 IMPLANT
GLIDESHEATH SLEND A-KIT 6F 22G (SHEATH) ×2 IMPLANT
GUIDEWIRE PRESSURE COMET II (WIRE) ×2 IMPLANT
KIT ENCORE 26 ADVANTAGE (KITS) ×2 IMPLANT
KIT HEART LEFT (KITS) ×2 IMPLANT
PACK CARDIAC CATHETERIZATION (CUSTOM PROCEDURE TRAY) ×2 IMPLANT
STENT SYNERGY DES 3X16 (Permanent Stent) ×2 IMPLANT
TRANSDUCER W/STOPCOCK (MISCELLANEOUS) ×2 IMPLANT
TUBING CIL FLEX 10 FLL-RA (TUBING) ×2 IMPLANT
WIRE HI TORQ VERSACORE-J 145CM (WIRE) ×2 IMPLANT
WIRE SAFE-T 1.5MM-J .035X260CM (WIRE) ×2 IMPLANT

## 2016-10-01 NOTE — Interval H&P Note (Signed)
Cath Lab Visit (complete for each Cath Lab visit)  Clinical Evaluation Leading to the Procedure:   ACS: No.  Non-ACS:    Anginal Classification: CCS II  Anti-ischemic medical therapy: No Therapy  Non-Invasive Test Results: Intermediate-risk stress test findings: cardiac mortality 1-3%/year  Prior CABG: No previous CABG      History and Physical Interval Note:  10/01/2016 1:53 PM  Eddie Mullins  has presented today for surgery, with the diagnosis of positive stress  The various methods of treatment have been discussed with the patient and family. After consideration of risks, benefits and other options for treatment, the patient has consented to  Procedure(s): Left Heart Cath and Coronary Angiography (N/A) as a surgical intervention .  The patient's history has been reviewed, patient examined, no change in status, stable for surgery.  I have reviewed the patient's chart and labs.  Questions were answered to the patient's satisfaction.     Belva Crome III

## 2016-10-01 NOTE — H&P (Signed)
Progress Notes   Eddie Perla, MD at 09/30/2016 3:00 PM   Status: Signed      Referring: Dr Inda Merlin  HPI: 60 yo male with PMH of DM, HTN, hyperlipidemia and cirrhosis for evaluation of new onset angina. Over past 3 months, pt complains of CP with riding bike, climbing stairs or hills, relieved with rest; associated dyspnea and diaphoresis; radiates to shoulders bilaterally; described as a tightness. Had ETT today in office with ST depression in recovery consistent with abnormal ETT. Note no CP at rest. Denies DOE, orthopnea, PND or pedal edema. Because of the above, we were asked to evaluate (patient seen as add on following abnormal ETT).        Current Outpatient Prescriptions  Medication Sig Dispense Refill  . amLODipine (NORVASC) 5 MG tablet Take 5 mg by mouth at bedtime.     Marland Kitchen aspirin EC 81 MG tablet Take 81 mg by mouth daily.    Marland Kitchen ibuprofen (ADVIL,MOTRIN) 200 MG tablet Take 800 mg by mouth every 6 (six) hours as needed (pain).    . insulin glargine (LANTUS) 100 unit/mL SOPN Inject 46 Units into the skin at bedtime.     Javier Docker Oil (HM MEGAKRILL PO) Take 500 mg by mouth daily.    Marland Kitchen losartan-hydrochlorothiazide (HYZAAR) 100-25 MG per tablet Take 1 tablet by mouth at bedtime.     . methylphenidate (RITALIN) 20 MG tablet Take 20 mg by mouth daily as needed (Use as directed).     . Multiple Vitamins-Minerals (MULTIVITAMIN WITH MINERALS) tablet Take 1 tablet by mouth daily.    Marland Kitchen omeprazole (PRILOSEC) 20 MG capsule Take 20 mg by mouth daily.     No current facility-administered medications for this visit.     No Known Allergies       Past Medical History:  Diagnosis Date  . Arthritis   . Asthma    as a child  . Bronchitis   . Cancer ([place])    basal skin ca  . Cirrhosis ([place])   . Diabetes mellitus type II   . GERD (gastroesophageal reflux disease)   . Hepatitis C   . Hyperlipidemia   . Hypertension          Past Surgical History:    Procedure Laterality Date  . ACROMIO-CLAVICULAR JOINT REPAIR    . CARDIAC CATHETERIZATION  2005   clear results.  . CHOLECYSTECTOMY    . COLONOSCOPY    . JOINT REPLACEMENT    . KNEE SURGERY    . LUMBAR LAMINECTOMY/DECOMPRESSION MICRODISCECTOMY Right 08/14/2014   Procedure: LUMBAR LAMINECTOMY/DECOMPRESSION MICRODISCECTOMY LUMBAR TWO-THREE;  Surgeon: Eustace Moore, MD;  Location: [facility];  Service: Neurosurgery;  Laterality: Right;  . ROTATOR CUFF REPAIR    . TONSILLECTOMY      Social History        Social History  . Marital status: Divorced    Spouse name: N/A  . Number of children: 1  . Years of education: N/A      Occupational History  . Not on file.         Social History Main Topics  . Smoking status: Never Smoker  . Smokeless tobacco: Former Systems developer    Quit date: 03/13/1999  . Alcohol use Yes     Comment: Occasional  . Drug use: No  . Sexual activity: Not on file       Other Topics Concern  . Not on file      Social History Narrative  .  No narrative on file    Family History  Problem Relation Age of Onset  . Hypertension Mother     ROS: no fevers or chills, productive cough, hemoptysis, dysphasia, odynophagia, melena, hematochezia, dysuria, hematuria, rash, seizure activity, orthopnea, PND, pedal edema, claudication. Remaining systems are negative.  Physical Exam:   Blood pressure 132/84, pulse 82, height 5' 10.5" (1.791 m), weight 224 lb (101.6 kg).  General:  Well developed/well nourished in NAD Skin warm/dry Patient not depressed No peripheral clubbing Back-normal HEENT-normal/normal eyelids Neck supple/normal carotid upstroke bilaterally; no bruits; no JVD; no thyromegaly chest - CTA/ normal expansion CV - RRR/normal S1 and S2; no murmurs, rubs or gallops;  PMI nondisplaced Abdomen -NT/ND, no HSM, no mass, + bowel sounds, no bruit 2+ femoral pulses, no bruits Ext-no edema, chords, 2+ DP Neuro-grossly  nonfocal  ECG -NSR with no ST changes  ETT with ST depression in recovery-abnormal ETT  1 New onset angina-patient's symptoms are concerning and ETT abnormal. Plan to continue ASA and add toprol 25 mg daily; proceed with cardiac catheterization; risks and benefits discussed and pt agrees to proceed (myocardial infarction, CVA and death). Note no symptoms at rest. Cath scheduled with Dr Tamala Julian for tomorrow.  2 HTN - BP controlled; continue present meds  3 Hyperlipidemia-if CAD noted on cath, would add statin but follow LFTs closely given h/o cirrhosis.  4 DM  Eddie Ruths, MD     Referring Provider   Josetta Huddle, MD      Diagnoses    Codes Comments  Positive cardiac stress test - Primary ICD-9-CM: 794.39 ICD-10-CM: R94.39   Angina decubitus ([place])  ICD-9-CM: 413.0 ICD-10-CM: I20.8   Other hyperlipidemia  ICD-9-CM: 272.4 ICD-10-CM: E78.4   Essential hypertension  ICD-9-CM: 401.9 ICD-10-CM: I10        Reason for Visit   Chest Pain New patient evaluation  Reason for Visit History    Ordered Medications    Disp Refills Start End  metoprolol succinate (TOPROL-XL) 25 MG 24 hr tablet 90 tablet 3 09/30/2016   Take 1 tablet (25 mg total) by mouth daily. - Oral  Level of Service   Level of Service  PR OFFICE CONSULTATION NEW/ESTAB PATIENT 80 MIN [99245]   Follow-up and Disposition   Return in about 6 years (around 09/30/2022) for Eddie Mullins.  Follow-up and Disposition History    All Charges for This Encounter   Code Description Service Date Service Provider Modifiers Qty  [phone] PR OFFICE CONSULTATION NEW/ESTAB PATIENT 45 MIN 09/30/2016 Eddie Perla, MD  1  AVS Reports   Date/Time Report Action User  09/30/2016 3:36 PM After Visit Summary Printed Dionicio Stall, RN  09/30/2016 3:34 PM After Visit Summary Printed Dionicio Stall, RN  09/30/2016 3:33 PM After Visit Summary Printed Dionicio Stall, RN  09/30/2016 3:33 PM After Visit Summary  Printed Dionicio Stall, RN  09/30/2016 3:31 PM After Visit Summary Printed Dionicio Stall, RN  09/30/2016 3:30 PM After Visit Summary Printed Dionicio Stall, RN  09/30/2016 3:27 PM After Visit Summary Printed Dionicio Stall, RN  Patient Instructions   Medication Instructions:  Your physician has recommended you make the following change in your medication:  1) Start Toprol XL 25 mg daily   Labwork: Your physician recommends that you return for lab work today: CMET/CBC/PT/INR   Testing/Procedures:  Your physician has requested that you have a cardiac catheterization. Cardiac catheterization is used to diagnose and/or treat various heart conditions. Doctors may recommend  this procedure for a number of different reasons. The most common reason is to evaluate chest pain. Chest pain can be a symptom of coronary artery disease (CAD), and cardiac catheterization can show whether plaque is narrowing or blocking your heart's arteries. This procedure is also used to evaluate the valves, as well as measure the blood flow and oxygen levels in different parts of your heart. For further information please visit [email or web address]. Please follow instruction sheet, as given.--10/01/16  .   Please arrive at the [facility] at 11:30am Do not eat or drink after midnight the prior to the procedure Okay to take your medications with a small sip of water No Insulin tomorrow morning Plan for one night stay Will need someone to drive you home after procedure    Follow-Up: Your physician recommends that you schedule a follow-up appointment in: 6 weeks with Dr Stanford Breed at our [facility] office   Any Other Special Instructions Will Be Listed Below (If Applicable).     If you need a refill on your cardiac medications before your next appointment, please call your pharmacy.      Patient Instructions History  Routing History   Recipient  Method Sent by Date Sent  Josetta Huddle, MD Fax Eddie Perla, MD 09/30/2016  Fax: [phone] Phone: [phone]   Previous Visit   Date & Time 09/30/2016 2:00 PM Provider Eddie Mullins, [place] Office Encounter # [id number]  Medical Necessity Transport Form   Medical Necessity  [employer] [id number] [phone]) [phone] y.o. M)

## 2016-10-01 NOTE — Research (Signed)
CADLAD Informed Consent   Subject Name: Eddie Mullins  Subject met inclusion and exclusion criteria.  The informed consent form, study requirements and expectations were reviewed with the subject and questions and concerns were addressed prior to the signing of the consent form.  The subject verbalized understanding of the trail requirements.  The subject agreed to participate in the CADLAD trial and signed the informed consent.  The informed consent was obtained prior to performance of any protocol-specific procedures for the subject.  A copy of the signed informed consent was given to the subject and a copy was placed in the subject's medical record.  [name] 10/01/2016, 13:46

## 2016-10-01 NOTE — Progress Notes (Signed)
Patient arrived on unit at 1530.  Right TR band level 0.  Patient noncompliant with activity restrictions to right arm.  Upon second deflation arterial site bled freely.  Six cc air re infused to band and will resume a conservative deflation regimen given the lack of compliance.

## 2016-10-01 NOTE — H&P (View-Only) (Signed)
Referring: Dr Inda Merlin  HPI: 60 yo male with PMH of DM, HTN, hyperlipidemia and cirrhosis for evaluation of new onset angina. Over past 3 months, pt complains of CP with riding bike, climbing stairs or hills, relieved with rest; associated dyspnea and diaphoresis; radiates to shoulders bilaterally; described as a tightness. Had ETT today in office with ST depression in recovery consistent with abnormal ETT. Note no CP at rest. Denies DOE, orthopnea, PND or pedal edema. Because of the above, we were asked to evaluate (patient seen as add on following abnormal ETT).  Current Outpatient Prescriptions  Medication Sig Dispense Refill  . amLODipine (NORVASC) 5 MG tablet Take 5 mg by mouth at bedtime.     Marland Kitchen aspirin EC 81 MG tablet Take 81 mg by mouth daily.    Marland Kitchen ibuprofen (ADVIL,MOTRIN) 200 MG tablet Take 800 mg by mouth every 6 (six) hours as needed (pain).    . insulin glargine (LANTUS) 100 unit/mL SOPN Inject 46 Units into the skin at bedtime.     Javier Docker Oil (HM MEGAKRILL PO) Take 500 mg by mouth daily.    Marland Kitchen losartan-hydrochlorothiazide (HYZAAR) 100-25 MG per tablet Take 1 tablet by mouth at bedtime.     . methylphenidate (RITALIN) 20 MG tablet Take 20 mg by mouth daily as needed (Use as directed).     . Multiple Vitamins-Minerals (MULTIVITAMIN WITH MINERALS) tablet Take 1 tablet by mouth daily.    Marland Kitchen omeprazole (PRILOSEC) 20 MG capsule Take 20 mg by mouth daily.     No current facility-administered medications for this visit.     No Known Allergies   Past Medical History:  Diagnosis Date  . Arthritis   . Asthma    as a child  . Bronchitis   . Cancer ([place])    basal skin ca  . Cirrhosis ([place])   . Diabetes mellitus type II   . GERD (gastroesophageal reflux disease)   . Hepatitis C   . Hyperlipidemia   . Hypertension     Past Surgical History:  Procedure Laterality Date  . ACROMIO-CLAVICULAR JOINT REPAIR    . CARDIAC CATHETERIZATION  2005   clear results.  . CHOLECYSTECTOMY    .  COLONOSCOPY    . JOINT REPLACEMENT    . KNEE SURGERY    . LUMBAR LAMINECTOMY/DECOMPRESSION MICRODISCECTOMY Right 08/14/2014   Procedure: LUMBAR LAMINECTOMY/DECOMPRESSION MICRODISCECTOMY LUMBAR TWO-THREE;  Surgeon: Eustace Moore, MD;  Location: [facility];  Service: Neurosurgery;  Laterality: Right;  . ROTATOR CUFF REPAIR    . TONSILLECTOMY      Social History   Social History  . Marital status: Divorced    Spouse name: N/A  . Number of children: 1  . Years of education: N/A   Occupational History  . Not on file.   Social History Main Topics  . Smoking status: Never Smoker  . Smokeless tobacco: Former Systems developer    Quit date: 03/13/1999  . Alcohol use Yes     Comment: Occasional  . Drug use: No  . Sexual activity: Not on file   Other Topics Concern  . Not on file   Social History Narrative  . No narrative on file    Family History  Problem Relation Age of Onset  . Hypertension Mother     ROS: no fevers or chills, productive cough, hemoptysis, dysphasia, odynophagia, melena, hematochezia, dysuria, hematuria, rash, seizure activity, orthopnea, PND, pedal edema, claudication. Remaining systems are negative.  Physical Exam:   Blood pressure  132/84, pulse 82, height 5' 10.5" (1.791 m), weight 224 lb (101.6 kg).  General:  Well developed/well nourished in NAD Skin warm/dry Patient not depressed No peripheral clubbing Back-normal HEENT-normal/normal eyelids Neck supple/normal carotid upstroke bilaterally; no bruits; no JVD; no thyromegaly chest - CTA/ normal expansion CV - RRR/normal S1 and S2; no murmurs, rubs or gallops;  PMI nondisplaced Abdomen -NT/ND, no HSM, no mass, + bowel sounds, no bruit 2+ femoral pulses, no bruits Ext-no edema, chords, 2+ DP Neuro-grossly nonfocal  ECG -NSR with no ST changes  ETT with ST depression in recovery-abnormal ETT  1 New onset angina-patient's symptoms are concerning and ETT abnormal. Plan to continue ASA and add toprol 25 mg  daily; proceed with cardiac catheterization; risks and benefits discussed and pt agrees to proceed (myocardial infarction, CVA and death). Note no symptoms at rest. Cath scheduled with Dr Tamala Julian for tomorrow.  2 HTN - BP controlled; continue present meds  3 Hyperlipidemia-if CAD noted on cath, would add statin but follow LFTs closely given h/o cirrhosis.  4 DM  [name], MD

## 2016-10-02 ENCOUNTER — Encounter (HOSPITAL_COMMUNITY): Payer: Self-pay | Admitting: Cardiology

## 2016-10-02 ENCOUNTER — Encounter: Payer: Self-pay | Admitting: *Deleted

## 2016-10-02 DIAGNOSIS — K7460 Unspecified cirrhosis of liver: Secondary | ICD-10-CM | POA: Diagnosis not present

## 2016-10-02 DIAGNOSIS — Z959 Presence of cardiac and vascular implant and graft, unspecified: Secondary | ICD-10-CM | POA: Diagnosis not present

## 2016-10-02 DIAGNOSIS — E119 Type 2 diabetes mellitus without complications: Secondary | ICD-10-CM | POA: Diagnosis not present

## 2016-10-02 DIAGNOSIS — IMO0001 Reserved for inherently not codable concepts without codable children: Secondary | ICD-10-CM

## 2016-10-02 DIAGNOSIS — Z7982 Long term (current) use of aspirin: Secondary | ICD-10-CM | POA: Diagnosis not present

## 2016-10-02 DIAGNOSIS — Z79899 Other long term (current) drug therapy: Secondary | ICD-10-CM | POA: Diagnosis not present

## 2016-10-02 DIAGNOSIS — Z794 Long term (current) use of insulin: Secondary | ICD-10-CM | POA: Diagnosis not present

## 2016-10-02 DIAGNOSIS — I25118 Atherosclerotic heart disease of native coronary artery with other forms of angina pectoris: Secondary | ICD-10-CM | POA: Diagnosis not present

## 2016-10-02 DIAGNOSIS — I2510 Atherosclerotic heart disease of native coronary artery without angina pectoris: Secondary | ICD-10-CM | POA: Diagnosis not present

## 2016-10-02 DIAGNOSIS — Z95820 Peripheral vascular angioplasty status with implants and grafts: Secondary | ICD-10-CM

## 2016-10-02 DIAGNOSIS — E785 Hyperlipidemia, unspecified: Secondary | ICD-10-CM

## 2016-10-02 DIAGNOSIS — K219 Gastro-esophageal reflux disease without esophagitis: Secondary | ICD-10-CM | POA: Diagnosis not present

## 2016-10-02 DIAGNOSIS — J45909 Unspecified asthma, uncomplicated: Secondary | ICD-10-CM | POA: Diagnosis not present

## 2016-10-02 DIAGNOSIS — Z006 Encounter for examination for normal comparison and control in clinical research program: Secondary | ICD-10-CM

## 2016-10-02 HISTORY — DX: Peripheral vascular angioplasty status with implants and grafts: Z95.820

## 2016-10-02 HISTORY — DX: Hyperlipidemia, unspecified: E78.5

## 2016-10-02 HISTORY — DX: Encounter for examination for normal comparison and control in clinical research program: Z00.6

## 2016-10-02 LAB — CBC
HCT: 40.3 % (ref 39.0–52.0)
Hemoglobin: 13.8 g/dL (ref 13.0–17.0)
MCH: 30.8 pg (ref 26.0–34.0)
MCHC: 34.2 g/dL (ref 30.0–36.0)
MCV: 90 fL (ref 78.0–100.0)
Platelets: 181 10*3/uL (ref 150–400)
RBC: 4.48 MIL/uL (ref 4.22–5.81)
RDW: 12.6 % (ref 11.5–15.5)
WBC: 6.4 10*3/uL (ref 4.0–10.5)

## 2016-10-02 LAB — BASIC METABOLIC PANEL
Anion gap: 6 (ref 5–15)
BUN: 15 mg/dL (ref 6–20)
CO2: 28 mmol/L (ref 22–32)
Calcium: 9 mg/dL (ref 8.9–10.3)
Chloride: 105 mmol/L (ref 101–111)
Creatinine, Ser: 1.12 mg/dL (ref 0.61–1.24)
GFR calc Af Amer: >60 mL/min (ref >60)
GFR calc non Af Amer: >60 mL/min (ref >60)
Glucose, Bld: 124 mg/dL — ABNORMAL HIGH (ref 65–99)
Potassium: 3.8 mmol/L (ref 3.5–5.1)
Sodium: 139 mmol/L (ref 135–145)

## 2016-10-02 LAB — GLUCOSE, CAPILLARY
Glucose-Capillary: 69 mg/dL (ref 65–99)
Glucose-Capillary: 72 mg/dL (ref 65–99)

## 2016-10-02 LAB — HEMOGLOBIN A1C
Hgb A1c MFr Bld: 7 % — ABNORMAL HIGH (ref 4.8–5.6)
Mean Plasma Glucose: 154 mg/dL

## 2016-10-02 MED ORDER — NITROGLYCERIN 0.4 MG SL SUBL: 0.4000 mg | SUBLINGUAL_TABLET | SUBLINGUAL | 4 refills | Status: AC | PRN

## 2016-10-02 MED ORDER — TICAGRELOR 90 MG PO TABS: 90.0000 mg | ORAL_TABLET | Freq: Two times a day (BID) | ORAL | Status: DC

## 2016-10-02 MED ORDER — NITROGLYCERIN 0.4 MG SL SUBL: 0.4000 mg | SUBLINGUAL_TABLET | SUBLINGUAL | Status: DC | PRN

## 2016-10-02 MED ORDER — ACETAMINOPHEN 325 MG PO TABS: 650.0000 mg | ORAL_TABLET | ORAL | Status: AC | PRN

## 2016-10-02 MED ORDER — ATORVASTATIN CALCIUM 80 MG PO TABS: 80.0000 mg | ORAL_TABLET | Freq: Every day | ORAL | 6 refills | Status: DC

## 2016-10-02 NOTE — Discharge Instructions (Signed)
Heart Healthy Diabetic Diet.  Take 1 NTG, under your tongue, while sitting.  If no relief of pain may repeat NTG, one tab every 5 minutes up to 3 tablets total over 15 minutes.  If no relief CALL 911.  If you have dizziness/lightheadness  while taking NTG, stop taking and call 911.        Call [facility] at [phone] if any bleeding, swelling or drainage at cath site.  May shower, no tub baths for 48 hours for groin sticks. No lifting over 5 pounds for 3 days.  No Driving for 3 days  Do not stop study Drugs, important to keep stents open.

## 2016-10-02 NOTE — Progress Notes (Unsigned)
_0   Hide copied text '[]'$ Hover for attribution information TWILIGHT Informed Consent   Subject Name: Eddie Mullins  Subject met inclusion and exclusion criteria.  The informed consent form, study requirements and expectations were reviewed with the subject and questions and concerns were addressed prior to the signing of the consent form.  The subject verbalized understanding of the trail requirements.  The subject agreed to participate in the TWILIGHT trial and signed the informed consent.  The informed consent was obtained prior to performance of any protocol-specific procedures for the subject.  A copy of the signed informed consent was given to the subject and a copy was placed in the subject's medical record.  [name] 10/02/2016, [phone]

## 2016-10-02 NOTE — Progress Notes (Signed)
CARDIAC REHAB PHASE I   PRE:  Rate/Rhythm: 82  BP:  Sitting: 143/94     SaO2: 99 ra  MODE:  Ambulation: 1000 ft   POST:  Rate/Rhythm: 70  BP:  Sitting: 149/88     SaO2: 98 ra  7:50-8:25 Patient ambulated independently at an easy pace. No complaints. Education completed. Will come to Cardiac Rehab at [facility] if works with work schedule.   [place], MS 10/02/2016 8:23 AM

## 2016-10-02 NOTE — Progress Notes (Signed)
TR BAND REMOVAL  LOCATION:    right radial  DEFLATED PER PROTOCOL:    Yes.    TIME BAND OFF / DRESSING APPLIED:    2030    SITE UPON ARRIVAL:    Level 0  SITE AFTER BAND REMOVAL:    Level 0  CIRCULATION SENSATION AND MOVEMENT:    Within Normal Limits   Yes.    COMMENTS:   Pt tolerated well; has been compliant with radial site care since my initial assessment.

## 2016-10-02 NOTE — Discharge Summary (Signed)
Physician Discharge Summary       Patient ID: Eddie Mullins MRN: [id number] DOB/AGE: March 15, 1956 60 y.o.  Admit date: 10/01/2016 Discharge date: 10/02/2016 Primary Cardiologist: Dr. Stanford Breed   Discharge Diagnoses:  Principal Problem:   Abnormal stress ECG Active Problems:   S/P angioplasty with stent, DES, Synergy to LAD 10/01/16   CAD in native artery   Angina pectoris (HCC)   DM (diabetes mellitus), type 2 ([place])   Hyperlipidemia LDL goal <70   Research study patient   Discharged Condition: good  Procedures: 10/01/16  By Dr. Tamala Julian Procedures   Coronary Stent Intervention  Intravascular Pressure Wire/FFR Study  Left Heart Cath and Coronary Angiography  Conclusion    70-75% segmental proximal LAD stenosis with superimposed spasm. The spasm was relieved with IC NTG, and obstruction was still in the 70-80% range but more focal. FFR documented significant value of  0.78.  Successful PTCA and stenting of the ostial to proximal LAD reducing the 70-80% stenosis to 0% with TIMI grade 3 flow. 16 x 3.0 mm Synergy postdilated to 3.25 mm in diameter.  Normal right coronary artery.  Minimal luminal irregularities noted in the circumflex coronary artery.  Normal LV function.  RECOMMENDATION:  Dual antiplatelet therapy. Eligible for the Twilight study.  Discharge in a.m.  Aggressive risk factor modification. High intensity statin therapy added.  The left ventricular size is normal. The left ventricular systolic function is normal. LV end diastolic pressure is normal. The left ventricular ejection fraction is 55-65% by visual estimate. No regional wall motion abnormalities   Hospital Course:   60 yo male with PMH of DM, HTN, hyperlipidemia and cirrhosis for evaluation of new onset angina. Over past 3 months, pt complained of CP with riding bike, climbing stairs or hills, relieved with rest; associated dyspnea and diaphoresis; radiates to shoulders bilaterally; described as  a tightness. Had ETT 09/30/16 in office with ST depression in recovery consistent with abnormal ETT. Note no CP at rest. Denies DOE, orthopnea, PND or pedal edema. Because of the above, we were asked to evaluate.  Was seen by Dr. Stanford Breed and plans were made for cardiac cath.    Pt had cardiac cath yesterday. Pt with LAD stenosis with spasm and FFR at 0.78.  Had PTCA and stenting of ostial to prox LAD with Synergy DES.  RCA patent.   LCX with luminal irregularities.    Post procedure pt has done well and ambulated with cardiac rehab today.  Labs stable.  Pt will be in Naplate study for Brilinta and asprin.  He is on statin and BB.  Is on ARB but EF is normal at 55-65%.  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 Though if problems he will call the office.   Consults: None  Significant Diagnostic Studies:  BMP Latest Ref Rng & Units 10/02/2016 10/01/2016 09/30/2016  Glucose 65 - 99 mg/dL 124(H) - 119(H)  BUN 6 - 20 mg/dL 15 - 20  Creatinine 0.61 - 1.24 mg/dL 1.12 1.02 0.92  Sodium 135 - 145 mmol/L 139 - 138  Potassium 3.5 - 5.1 mmol/L 3.8 - 4.0  Chloride 101 - 111 mmol/L 105 - 100  CO2 22 - 32 mmol/L 28 - 28  Calcium 8.9 - 10.3 mg/dL 9.0 - 9.9   CBC Latest Ref Rng & Units 10/02/2016 10/01/2016 09/30/2016  WBC 4.0 - 10.5 K/uL 6.4 8.4 8.0  Hemoglobin 13.0 - 17.0 g/dL 13.8 14.3 14.9  Hematocrit 39.0 - 52.0 % 40.3  42.3 43.7  Platelets 150 - 400 K/uL 181 195 216    Discharge Exam: Blood pressure 133/85, pulse 66, temperature 98 F (36.7 C), temperature source Oral, resp. rate 16, height 5' 10.5" (1.791 m), weight 227 lb 8.2 oz (103.2 kg), SpO2 97 %. General:Pleasant affect, NAD Skin:Warm and dry, brisk capillary refill HEENT:normocephalic, sclera clear, mucus membranes moist Neck:supple, no JVD, no bruits  Heart:S1S2 RRR without murmur, gallup, rub or click Lungs:clear without rales, rhonchi, or wheezes [id number], non tender, + BS, do not palpate liver spleen or masses Ext:no  lower ext edema, 2+ pedal pulses, 2+ radial pulses, cath site with mild swelling. Neuro:alert and oriented X 3, MAE, follows commands, + facial symmetry Tele:  SB SR  Disposition: 01-Home or Self Care  Discharge Instructions    Amb Referral to Cardiac Rehabilitation    Complete by:  As directed    Diagnosis:  Coronary Stents       Medication List    STOP taking these medications   ibuprofen 200 MG tablet Commonly known as:  ADVIL,MOTRIN     TAKE these medications   acetaminophen 325 MG tablet Commonly known as:  TYLENOL Take 2 tablets (650 mg total) by mouth every 4 (four) hours as needed for headache or mild pain.   amLODipine 5 MG tablet Commonly known as:  NORVASC Take 5 mg by mouth daily.   aspirin EC 81 MG tablet Take 81 mg by mouth daily.   atorvastatin 80 MG tablet Commonly known as:  LIPITOR Take 1 tablet (80 mg total) by mouth daily at 6 PM.   insulin glargine 100 unit/mL Sopn Commonly known as:  LANTUS Inject 46 Units into the skin at bedtime.   Krill Oil 300 MG Caps Take 900 mg by mouth daily.   losartan-hydrochlorothiazide 100-25 MG tablet Commonly known as:  HYZAAR Take 1 tablet by mouth daily.   methylphenidate 20 MG tablet Commonly known as:  RITALIN Take 20 mg by mouth daily as needed (for ADD).   metoprolol succinate 25 MG 24 hr tablet Commonly known as:  TOPROL-XL Take 1 tablet (25 mg total) by mouth daily.   multivitamin with minerals tablet Take 1 tablet by mouth daily.   nitroGLYCERIN 0.4 MG SL tablet Commonly known as:  NITROSTAT Place 1 tablet (0.4 mg total) under the tongue every 5 (five) minutes as needed for chest pain.   omeprazole 20 MG capsule Commonly known as:  PRILOSEC Take 20 mg by mouth daily.   ticagrelor 90 MG Tabs tablet Commonly known as:  BRILINTA Take 1 tablet (90 mg total) by mouth 2 (two) times daily.         Discharge Instructions: Heart Healthy Diabetic Diet.  Take 1 NTG, under your tongue, while  sitting.  If no relief of pain may repeat NTG, one tab every 5 minutes up to 3 tablets total over 15 minutes.  If no relief CALL 911.  If you have dizziness/lightheadness  while taking NTG, stop taking and call 911.        Call [facility] at [phone] if any bleeding, swelling or drainage at cath site.  May shower, no tub baths for 48 hours for groin sticks. No lifting over 5 pounds for 3 days.  No Driving for 3 days  Do not stop study Drugs, important to keep stents open.    Signed: Cecilie Kicks Nurse Practitioner-Certified [facility]: [facility] 10/02/2016, 8:58 AM  Time spent on discharge : >  30 minutes.    I have seen, examined the patient, and reviewed the above assessment and plan.  Changes to above are made where necessary.  On exam, RRR. Radial site looks good.  Routine post PCI follow-up.  Co Sign: Thompson Grayer, MD 10/02/2016 10:12 AM

## 2016-10-04 ENCOUNTER — Other Ambulatory Visit: Payer: Self-pay | Admitting: *Deleted

## 2016-10-04 ENCOUNTER — Encounter (HOSPITAL_COMMUNITY): Payer: Self-pay | Admitting: Interventional Cardiology

## 2016-10-04 MED ORDER — AMBULATORY NON FORMULARY MEDICATION: 81.0000 mg | Freq: Every day | Status: DC

## 2016-10-04 MED ORDER — AMBULATORY NON FORMULARY MEDICATION: 90.0000 mg | Freq: Two times a day (BID) | Status: DC

## 2016-10-04 MED FILL — METHYLPHENIDATE 20 MG TAB: 20 | 30 days supply | Qty: 30 | Fill #0

## 2016-10-04 MED FILL — ATORVASTATIN 80 MG TABLET: 80 | 90 days supply | Qty: 90 | Fill #0

## 2016-10-05 MED FILL — UNIFINE PENTIPS 8MM 31G: 31G X 8 MM | 90 days supply | Qty: 100 | Fill #2

## 2016-10-07 ENCOUNTER — Telehealth: Payer: Self-pay | Admitting: Cardiology

## 2016-10-14 ENCOUNTER — Ambulatory Visit (INDEPENDENT_AMBULATORY_CARE_PROVIDER_SITE_OTHER): Payer: 59 | Admitting: Cardiology

## 2016-10-14 ENCOUNTER — Encounter: Payer: Self-pay | Admitting: Cardiology

## 2016-10-14 VITALS — BP 128/84 | HR 66 | Ht 70.5 in | Wt 223.6 lb

## 2016-10-14 DIAGNOSIS — E119 Type 2 diabetes mellitus without complications: Secondary | ICD-10-CM

## 2016-10-14 DIAGNOSIS — Z959 Presence of cardiac and vascular implant and graft, unspecified: Secondary | ICD-10-CM

## 2016-10-14 DIAGNOSIS — Z006 Encounter for examination for normal comparison and control in clinical research program: Secondary | ICD-10-CM | POA: Diagnosis not present

## 2016-10-14 DIAGNOSIS — K648 Other hemorrhoids: Secondary | ICD-10-CM

## 2016-10-14 DIAGNOSIS — Z794 Long term (current) use of insulin: Secondary | ICD-10-CM

## 2016-10-14 DIAGNOSIS — Z95820 Peripheral vascular angioplasty status with implants and grafts: Secondary | ICD-10-CM

## 2016-10-14 DIAGNOSIS — IMO0001 Reserved for inherently not codable concepts without codable children: Secondary | ICD-10-CM

## 2016-10-14 DIAGNOSIS — B182 Chronic viral hepatitis C: Secondary | ICD-10-CM

## 2016-10-14 DIAGNOSIS — E785 Hyperlipidemia, unspecified: Secondary | ICD-10-CM

## 2016-10-14 MED FILL — LANTUS SOLOSTAR 100 UNITS/M: 100 | 27 days supply | Qty: 15 | Fill #1

## 2016-10-14 MED FILL — AMLODIPINE BESYLATE 5 MG TA: 5 | 90 days supply | Qty: 90 | Fill #0

## 2016-10-14 NOTE — Progress Notes (Signed)
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 He has a history of IDDM, HTN, and dyslipidemia. The pt had a remote cath in 2005 that showed no significant CAD. At that time it was felt his symptoms were fo Imitrex induced coronary spasm. Marland Kitchen Recently he resumed cycling for exercise. With this he noted exertional chest tightness. A stress test was done as an OP 09/30/16 which was abnormal. He saw Dr Stanford Breed in the office and was set up for an OP cath which was done 10/01/16. This revealed 70-75% pLAD with superimposed coronary spasm. This was treated with PCI/ DES. He tolerated this well. LVF was WNL and he had no other significant CAD. He was enrolled in the [place].   He is seen to day in the office for follow up. He has not had further chest pain. He does have hemorrhoids and has noted some increased bleeding and thinks he will need to see his surgeon about this. He has had previous banding. He does have a history of Hepatits C and was treated last year with Harvoni. He is followed by [facility] sytem and is doing well from that standpoint, his LFTs were WNL.    Current Outpatient Prescriptions  Medication Sig Dispense Refill  . acetaminophen (TYLENOL) 325 MG tablet Take 2 tablets (650 mg total) by mouth every 4 (four) hours as needed for headache or mild pain.    Marland Kitchen AMBULATORY NON FORMULARY MEDICATION Take 81 mg by mouth daily. Medication Name: aspirin, TWILIGHT Research Study, drug provided    . AMBULATORY NON FORMULARY MEDICATION Take 90 mg by mouth 2 (two) times daily. Medication Name:brilinta, TWILIGHT Research Study, drug provided    . amLODipine (NORVASC) 5 MG tablet Take 5 mg by mouth daily.     Marland Kitchen atorvastatin (LIPITOR) 80 MG tablet Take 1 tablet (80 mg total) by mouth daily at 6 PM. 30 tablet 6  . insulin  glargine (LANTUS) 100 unit/mL SOPN Inject 40 Units into the skin at bedtime.     Javier Docker Oil 300 MG CAPS Take 900 mg by mouth daily.    Marland Kitchen losartan-hydrochlorothiazide (HYZAAR) 100-25 MG per tablet Take 1 tablet by mouth daily.     . methylphenidate (RITALIN) 20 MG tablet Take 20 mg by mouth daily as needed (for ADD).     Marland Kitchen metoprolol succinate (TOPROL-XL) 25 MG 24 hr tablet Take 1 tablet (25 mg total) by mouth daily. 90 tablet 3  . Multiple Vitamins-Minerals (MULTIVITAMIN WITH MINERALS) tablet Take 1 tablet by mouth daily.    . nitroGLYCERIN (NITROSTAT) 0.4 MG SL tablet Place 1 tablet (0.4 mg total) under the tongue every 5 (five) minutes as needed for chest pain. 26 tablet 4  . omeprazole (PRILOSEC) 20 MG capsule Take 20 mg by mouth daily.     No current facility-administered medications for this visit.     No Known Allergies  Social History   Social History  . Marital status: Divorced    Spouse name: N/A  . Number of children: 1  . Years of education: N/A   Occupational History  . Not on file.   Social History Main Topics  . Smoking status: Never Smoker  . Smokeless tobacco: Former Systems developer    Quit date: 03/13/1999  . Alcohol use Yes  Comment: Occasional  . Drug use: No  . Sexual activity: Not on file   Other Topics Concern  . Not on file   Social History Narrative  . No narrative on file     Review of Systems: General: negative for chills, fever, night sweats or weight changes.  Cardiovascular: negative for chest pain, dyspnea on exertion, edema, orthopnea, palpitations, paroxysmal nocturnal dyspnea or shortness of breath Dermatological: negative for rash Respiratory: negative for cough or wheezing Urologic: negative for hematuria Abdominal: negative for nausea, vomiting, diarrhea, or hematemesis Hemorrhoidal bleeding Neurologic: negative for visual changes, syncope, or dizziness All other systems reviewed and are otherwise negative except as noted  above.    Blood pressure 128/84, pulse 66, height 5' 10.5" (1.791 m), weight 223 lb 9.6 oz (101.4 kg).  General appearance: alert, cooperative, no distress and mildly obese Neck: no carotid bruit and no JVD Lungs: clear to auscultation bilaterally Heart: regular rate and rhythm Extremities: Rt RA site without hematoma Skin: Skin color, texture, turgor normal. No rashes or lesions Neurologic: Grossly normal   ASSESSMENT AND PLAN:   S/P angioplasty with stent, DES, Synergy to LAD 10/01/16 Pt had c/p and abnormal stress as an OP- elective cath and subsequent LAD PCI with DES  Research study patient Twilight Study   Hepatitis C, chronic ([place]) Biopsy 2005--treated with Harvoni 2016  Hemorrhoids, internal, with bleeding He has noticed some increased hemorrhoidal bleeding  Hyperlipidemia LDL goal <70 Now on high dose statin Rx. Pt says LFTs will be followed by his hepatologist, there were WLN during his hospitalization.   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 I told him he could resume exercise gradually as tolerated. He knows he can't stop Twilight Study therapy. I suggested he talk to his surgeon about hemorrhoid banding if needed. F/U Dr Stanford Breed in 3 months.   Kerin Ransom PA-C 10/14/2016 9:34 AM

## 2016-10-14 NOTE — Assessment & Plan Note (Signed)
Twilight Study

## 2016-10-14 NOTE — Patient Instructions (Signed)
Medication Instructions:  Your physician recommends that you continue on your current medications as directed. Please refer to the Current Medication list given to you today.  Labwork: None   Testing/Procedures: None   Follow-Up: Your physician recommends that you schedule a follow-up appointment in: 3 months with Dr Crenshaw.  Any Other Special Instructions Will Be Listed Below (If Applicable). If you need a refill on your cardiac medications before your next appointment, please call your pharmacy.  

## 2016-10-14 NOTE — Assessment & Plan Note (Signed)
Type 2 IDDM 

## 2016-10-14 NOTE — Assessment & Plan Note (Signed)
Now on high dose statin Rx. Pt says LFTs will be followed by his hepatologist, there were WLN during his hospitalization.

## 2016-10-14 NOTE — Assessment & Plan Note (Addendum)
Biopsy 2005--treated with Harvoni 2016

## 2016-10-14 NOTE — Assessment & Plan Note (Signed)
Pt had c/p and abnormal stress as an OP- elective cath and subsequent LAD PCI with DES

## 2016-10-14 NOTE — Assessment & Plan Note (Signed)
He has noticed some increased hemorrhoidal bleeding

## 2016-10-20 DIAGNOSIS — K7469 Other cirrhosis of liver: Secondary | ICD-10-CM | POA: Diagnosis not present

## 2016-10-20 DIAGNOSIS — B182 Chronic viral hepatitis C: Secondary | ICD-10-CM | POA: Diagnosis not present

## 2016-10-20 NOTE — Telephone Encounter (Signed)
Closed encounter °

## 2016-10-21 ENCOUNTER — Other Ambulatory Visit: Payer: Self-pay | Admitting: Nurse Practitioner

## 2016-10-21 DIAGNOSIS — K7469 Other cirrhosis of liver: Secondary | ICD-10-CM

## 2016-11-02 MED FILL — LOSARTAN-HCTZ 100-25 MG TAB: 100-25 | 90 days supply | Qty: 90 | Fill #1

## 2016-11-03 ENCOUNTER — Telehealth: Payer: Self-pay | Admitting: *Deleted

## 2016-11-03 NOTE — Telephone Encounter (Signed)
Left message for patient to call research office for the TWILIGHT research study follow up.

## 2016-11-04 ENCOUNTER — Encounter: Payer: Self-pay | Admitting: *Deleted

## 2016-11-04 DIAGNOSIS — Z006 Encounter for examination for normal comparison and control in clinical research program: Secondary | ICD-10-CM

## 2016-11-04 NOTE — Progress Notes (Signed)
TWILIGHT research study month 1 telephone follow up completed. Patient states he is having on and off hemorrhoidal bleeding that started within 1 week of starting Brilinta. This bleeding is not all the time he has had 2-3 episodes in last few weeks. He spoke to cardiology in follow up visit and understands he can not stop DAPT. Patient is unsure if he will continue TWILIGHT research study because he may require surgery. 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 I also that the study allowed cessation from study provided medication. Patient is going to make a decision before the end of his 3 month randomization window 25/JAN/2018 if he wishes to continue in the study. Questions encouraged and answered.

## 2016-11-09 ENCOUNTER — Ambulatory Visit: Payer: 59 | Admitting: Cardiology

## 2016-11-10 ENCOUNTER — Ambulatory Visit: Payer: 59 | Admitting: Physician Assistant

## 2016-11-16 ENCOUNTER — Ambulatory Visit
Admission: RE | Admit: 2016-11-16 | Discharge: 2016-11-16 | Disposition: A | Discharge status: Home / Self Care | Payer: 59 | Source: Ambulatory Visit | Attending: Nurse Practitioner | Admitting: Nurse Practitioner

## 2016-11-16 DIAGNOSIS — K7469 Other cirrhosis of liver: Secondary | ICD-10-CM

## 2016-11-16 DIAGNOSIS — K7460 Unspecified cirrhosis of liver: Secondary | ICD-10-CM | POA: Diagnosis not present

## 2016-11-18 MED FILL — LANTUS SOLOSTAR 100 UNITS/M: 100 | 27 days supply | Qty: 15 | Fill #2

## 2016-11-19 MED FILL — METHYLPHENIDATE 20 MG TAB: 20 | 30 days supply | Qty: 30 | Fill #0

## 2016-11-23 DIAGNOSIS — H52203 Unspecified astigmatism, bilateral: Secondary | ICD-10-CM | POA: Diagnosis not present

## 2016-11-23 DIAGNOSIS — H524 Presbyopia: Secondary | ICD-10-CM | POA: Diagnosis not present

## 2016-11-23 MED FILL — PREVIDENT 5000 SENSITIVE PA: 1.1-5 | 30 days supply | Qty: 100 | Fill #0

## 2016-12-22 MED FILL — LANTUS SOLOSTAR 100 UNITS/M: 100 | 27 days supply | Qty: 15 | Fill #3

## 2016-12-22 MED FILL — ATORVASTATIN 80 MG TABLET: 80 | 90 days supply | Qty: 90 | Fill #1

## 2016-12-22 MED FILL — METOPROLOL SUCC ER 25 MG TA: 25 | 90 days supply | Qty: 90 | Fill #1

## 2016-12-24 MED FILL — METHYLPHENIDATE 20 MG TAB: 20 | 30 days supply | Qty: 30 | Fill #0

## 2016-12-29 ENCOUNTER — Other Ambulatory Visit: Payer: Self-pay | Admitting: *Deleted

## 2016-12-29 ENCOUNTER — Encounter: Payer: Self-pay | Admitting: *Deleted

## 2016-12-29 DIAGNOSIS — Z006 Encounter for examination for normal comparison and control in clinical research program: Secondary | ICD-10-CM

## 2016-12-29 MED ORDER — AMBULATORY NON FORMULARY MEDICATION: 81.0000 mg | Freq: Every day | Status: DC

## 2016-12-29 NOTE — Progress Notes (Signed)
TWILIGHT Research study randomization month 3 visit completed. Patient states he has experienced several episodes of slight bleeding with his hemorrhoid. This has been an issue prior to study enrollment (2014) and has not really worsened.  He did have 1 episode on prior to month 1 visit than was captured and no other significant episodes. Today he has been randomized to ASA 81 mg daily or PLACEBO. He states he has been compliant with medication. The following bottles were dispensed [id number]; [id number]. Questions encouraged and answered. Next research required visit is due no later than 09/AUG/2018. 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

## 2017-01-05 MED FILL — UNIFINE PENTIPS 8MM 31G: 31G X 8 MM | 90 days supply | Qty: 100 | Fill #0

## 2017-01-05 NOTE — Progress Notes (Signed)
HPI: FU CAD. Cardiac cath 10/17 showed 70-80 LAD and normal LV function; Pt had PCI of LAD with DES. Since last seen, the patient denies any dyspnea on exertion, orthopnea, PND, pedal edema, palpitations, syncope or chest pain.   Current Outpatient Prescriptions  Medication Sig Dispense Refill  . acetaminophen (TYLENOL) 325 MG tablet Take 2 tablets (650 mg total) by mouth every 4 (four) hours as needed for headache or mild pain.    Marland Kitchen AMBULATORY NON FORMULARY MEDICATION Take 90 mg by mouth 2 (two) times daily. Medication Name:brilinta, TWILIGHT Research Study, drug provided    . AMBULATORY NON FORMULARY MEDICATION Take 81 mg by mouth daily. Medication Name: ASPIRIN 81 mg Daily or PLACEBO (TWILIGHT Research study PROVIDED)    . amLODipine (NORVASC) 5 MG tablet Take 5 mg by mouth daily.     Marland Kitchen atorvastatin (LIPITOR) 80 MG tablet Take 1 tablet (80 mg total) by mouth daily at 6 PM. 30 tablet 6  . insulin glargine (LANTUS) 100 unit/mL SOPN Inject 40 Units into the skin at bedtime.     Javier Docker Oil 300 MG CAPS Take 900 mg by mouth daily.    Marland Kitchen losartan-hydrochlorothiazide (HYZAAR) 100-25 MG per tablet Take 1 tablet by mouth daily.     . methylphenidate (RITALIN) 20 MG tablet Take 20 mg by mouth daily as needed (for ADD).     Marland Kitchen metoprolol succinate (TOPROL-XL) 25 MG 24 hr tablet Take 1 tablet (25 mg total) by mouth daily. 90 tablet 3  . Multiple Vitamins-Minerals (MULTIVITAMIN WITH MINERALS) tablet Take 1 tablet by mouth daily.    . nitroGLYCERIN (NITROSTAT) 0.4 MG SL tablet Place 1 tablet (0.4 mg total) under the tongue every 5 (five) minutes as needed for chest pain. 26 tablet 4  . omeprazole (PRILOSEC) 20 MG capsule Take 20 mg by mouth daily.     No current facility-administered medications for this visit.      Past Medical History:  Diagnosis Date  . Abnormal stress ECG with treadmill   . Anginal pain ([place])   . Arthritis   . Asthma    as a child  . Bronchitis   . Cancer ([place])    basal skin ca  . Cirrhosis ([place])   . Coronary artery disease   . Diabetes mellitus type II   . GERD (gastroesophageal reflux disease)   . Hepatitis C   . Hyperlipidemia   . Hyperlipidemia LDL goal <70 10/02/2016  . Hypertension   . Research study patient 10/02/2016  . S/P angioplasty with stent, DES, Synergy to LAD 10/01/16 10/02/2016    Past Surgical History:  Procedure Laterality Date  . ACROMIO-CLAVICULAR JOINT REPAIR    . CARDIAC CATHETERIZATION  2005   clear results.  Marland Kitchen CARDIAC CATHETERIZATION N/A 10/01/2016   Procedure: Left Heart Cath and Coronary Angiography;  Surgeon: Belva Crome, MD;  Location: [place] CV [facility];  Service: Cardiovascular;  Laterality: N/A;  . CARDIAC CATHETERIZATION N/A 10/01/2016   Procedure: Intravascular Pressure Wire/FFR Study;  Surgeon: Belva Crome, MD;  Location: [place] CV [facility];  Service: Cardiovascular;  Laterality: N/A;  . CARDIAC CATHETERIZATION N/A 10/01/2016   Procedure: Coronary Stent Intervention;  Surgeon: Belva Crome, MD;  Location: [place] CV LAB;  Service: Cardiovascular;  Laterality: N/A;  . CHOLECYSTECTOMY    . COLONOSCOPY    . CORONARY STENT PLACEMENT  10/01/2016   Successful PTCA and stenting of the ostial to proximal LAD  . JOINT REPLACEMENT    .  KNEE SURGERY    . LUMBAR LAMINECTOMY/DECOMPRESSION MICRODISCECTOMY Right 08/14/2014   Procedure: LUMBAR LAMINECTOMY/DECOMPRESSION MICRODISCECTOMY LUMBAR TWO-THREE;  Surgeon: Eustace Moore, MD;  Location: [place] [facility];  Service: Neurosurgery;  Laterality: Right;  . ROTATOR CUFF REPAIR    . TONSILLECTOMY      Social History   Social History  . Marital status: Divorced    Spouse name: N/A  . Number of children: 1  . Years of education: N/A   Occupational History  . Not on file.   Social History Main Topics  . Smoking status: Never Smoker  . Smokeless tobacco: Former Systems developer    Quit date: 03/13/1999  . Alcohol use Yes     Comment: Occasional  . Drug use: No  . Sexual  activity: Not on file   Other Topics Concern  . Not on file   Social History Narrative  . No narrative on file    Family History  Problem Relation Age of Onset  . Hypertension Mother     ROS: no fevers or chills, productive cough, hemoptysis, dysphasia, odynophagia, melena, hematochezia, dysuria, hematuria, rash, seizure activity, orthopnea, PND, pedal edema, claudication. Remaining systems are negative.  Physical Exam: Well-developed well-nourished in no acute distress.  Skin is warm and dry.  HEENT is normal.  Neck is supple. No bruits Chest is clear to auscultation with normal expansion.  Cardiovascular exam is regular rate and rhythm.  Abdominal exam nontender or distended. No masses palpated. Extremities show no edema. neuro grossly intact  A/P  1 CAD-continue ASA, brilinta and statin.  2 Hypertension-blood pressure controlled. Continue present medications.  3 hyperlipidemia-continue statin. He states his hepatologist and primary care follows his liver functions.  4 diabetes mellitus  [name], MD

## 2017-01-13 ENCOUNTER — Ambulatory Visit (INDEPENDENT_AMBULATORY_CARE_PROVIDER_SITE_OTHER): Payer: 59 | Admitting: Cardiology

## 2017-01-13 ENCOUNTER — Encounter: Payer: Self-pay | Admitting: Cardiology

## 2017-01-13 VITALS — BP 123/80 | HR 69 | Ht 70.0 in | Wt 219.0 lb

## 2017-01-13 DIAGNOSIS — I10 Essential (primary) hypertension: Secondary | ICD-10-CM

## 2017-01-13 DIAGNOSIS — I2510 Atherosclerotic heart disease of native coronary artery without angina pectoris: Secondary | ICD-10-CM

## 2017-01-13 DIAGNOSIS — E7800 Pure hypercholesterolemia, unspecified: Secondary | ICD-10-CM | POA: Diagnosis not present

## 2017-01-13 MED FILL — AMLODIPINE BESYLATE 5 MG TA: 5 | 90 days supply | Qty: 90 | Fill #1

## 2017-01-13 NOTE — Patient Instructions (Addendum)
Your physician wants you to follow-up in: 6 MONTHS WITH DR CRENSHAW You will receive a reminder letter in the mail two months in advance. If you don't receive a letter, please call our office to schedule the follow-up appointment.   If you need a refill on your cardiac medications before your next appointment, please call your pharmacy.  

## 2017-01-24 DIAGNOSIS — I2510 Atherosclerotic heart disease of native coronary artery without angina pectoris: Secondary | ICD-10-CM | POA: Diagnosis not present

## 2017-01-24 DIAGNOSIS — E1165 Type 2 diabetes mellitus with hyperglycemia: Secondary | ICD-10-CM | POA: Diagnosis not present

## 2017-01-24 DIAGNOSIS — G63 Polyneuropathy in diseases classified elsewhere: Secondary | ICD-10-CM | POA: Diagnosis not present

## 2017-01-24 DIAGNOSIS — N521 Erectile dysfunction due to diseases classified elsewhere: Secondary | ICD-10-CM | POA: Diagnosis not present

## 2017-01-24 DIAGNOSIS — I10 Essential (primary) hypertension: Secondary | ICD-10-CM | POA: Diagnosis not present

## 2017-01-24 DIAGNOSIS — F909 Attention-deficit hyperactivity disorder, unspecified type: Secondary | ICD-10-CM | POA: Diagnosis not present

## 2017-01-24 DIAGNOSIS — Z79899 Other long term (current) drug therapy: Secondary | ICD-10-CM | POA: Diagnosis not present

## 2017-01-24 DIAGNOSIS — Z794 Long term (current) use of insulin: Secondary | ICD-10-CM | POA: Diagnosis not present

## 2017-01-24 DIAGNOSIS — R946 Abnormal results of thyroid function studies: Secondary | ICD-10-CM | POA: Diagnosis not present

## 2017-01-24 MED FILL — METHYLPHENIDATE 20 MG TAB: 20 | 30 days supply | Qty: 30 | Fill #0

## 2017-01-24 MED FILL — LANTUS SOLOSTAR 100 UNITS/M: 100 | 27 days supply | Qty: 15 | Fill #4

## 2017-01-24 MED FILL — LOSARTAN-HCTZ 100-25 MG TAB: 100-25 | 90 days supply | Qty: 90 | Fill #2

## 2017-01-26 ENCOUNTER — Telehealth (HOSPITAL_COMMUNITY): Payer: Self-pay | Admitting: Internal Medicine

## 2017-01-26 NOTE — Telephone Encounter (Signed)
S/w Al[place]ra Free with UMR verifying coverage for Cardiac and Pulm Rehab Phase II. Deductible $1350.00 with $171.39 already met, Out of Pocket $3500.00 with $329.31 already met. 100% covered after Deductible met. 53 Sessions covered Reference # [id number] ... KJ

## 2017-01-27 ENCOUNTER — Telehealth: Payer: Self-pay | Admitting: *Deleted

## 2017-01-27 NOTE — Telephone Encounter (Signed)
Left message for patient to call research office for TWILIGHT Research Study month 4 telephone follow up.

## 2017-01-31 ENCOUNTER — Encounter: Payer: Self-pay | Admitting: *Deleted

## 2017-01-31 DIAGNOSIS — Z006 Encounter for examination for normal comparison and control in clinical research program: Secondary | ICD-10-CM

## 2017-01-31 NOTE — Progress Notes (Signed)
TWILIGHT Research study month 4 telephone follow up completed. Patient denies any bleeding events or other adverse events. He had previously mentioned a bleeding hemorrhoid that is unchanged since I last spoke with him. His next research required visit is due no later than 09/AUG/2018. Questions encouraged and answered.

## 2017-02-08 ENCOUNTER — Telehealth (HOSPITAL_COMMUNITY): Payer: Self-pay | Admitting: Internal Medicine

## 2017-02-08 ENCOUNTER — Encounter (HOSPITAL_COMMUNITY): Payer: Self-pay | Admitting: Internal Medicine

## 2017-02-08 NOTE — Telephone Encounter (Signed)
Mailed letter with Cardiac Rehab Program along with my chart message... KJ  °

## 2017-02-24 MED FILL — LANTUS SOLOSTAR 100 UNITS/M: 100 | 27 days supply | Qty: 15 | Fill #5

## 2017-02-24 MED FILL — METHYLPHENIDATE 20 MG TAB: 20 | 30 days supply | Qty: 30 | Fill #0

## 2017-03-04 ENCOUNTER — Telehealth (HOSPITAL_COMMUNITY): Payer: Self-pay | Admitting: Internal Medicine

## 2017-03-04 NOTE — Telephone Encounter (Signed)
I called patient and left message on patient voicemail to call office and let us know if still interested in participating in the Cardiac Rehab program.

## 2017-03-22 MED FILL — LANTUS SOLOSTAR 100 UNITS/M: 100 | 27 days supply | Qty: 15 | Fill #6

## 2017-03-22 MED FILL — METOPROLOL SUCC ER 25 MG TA: 25 | 90 days supply | Qty: 90 | Fill #2

## 2017-03-22 MED FILL — ATORVASTATIN 80 MG TABLET: 80 | 30 days supply | Qty: 30 | Fill #2

## 2017-03-30 MED FILL — METHYLPHENIDATE 20 MG TAB: 20 | 30 days supply | Qty: 30 | Fill #0

## 2017-04-13 MED FILL — UNIFINE PENTIPS 8MM 31G: 31G X 8 MM | 90 days supply | Qty: 100 | Fill #1

## 2017-04-13 MED FILL — AMLODIPINE BESYLATE 5 MG TA: 5 | 90 days supply | Qty: 90 | Fill #2

## 2017-04-13 MED FILL — LANTUS SOLOSTAR 100 UNITS/M: 100 | 33 days supply | Qty: 18 | Fill #0

## 2017-04-25 DIAGNOSIS — K7469 Other cirrhosis of liver: Secondary | ICD-10-CM | POA: Diagnosis not present

## 2017-04-26 ENCOUNTER — Other Ambulatory Visit: Payer: Self-pay | Admitting: Nurse Practitioner

## 2017-04-26 DIAGNOSIS — K7469 Other cirrhosis of liver: Secondary | ICD-10-CM

## 2017-04-27 MED FILL — LOSARTAN-HCTZ 100-25 MG TAB: 100-25 | 90 days supply | Qty: 90 | Fill #0

## 2017-04-29 ENCOUNTER — Other Ambulatory Visit: Payer: Self-pay | Admitting: Cardiology

## 2017-04-29 MED FILL — ATORVASTATIN 80 MG TABLET: 80 | 30 days supply | Qty: 30 | Fill #0

## 2017-05-03 MED FILL — METHYLPHENIDATE 20 MG TAB: 20 | 30 days supply | Qty: 30 | Fill #0

## 2017-05-23 MED FILL — LANTUS SOLOSTAR 100 UNITS/M: 100 | 32 days supply | Qty: 18 | Fill #1

## 2017-05-31 MED FILL — ATORVASTATIN 80 MG TABLET: 80 | 90 days supply | Qty: 90 | Fill #1

## 2017-06-13 MED FILL — METHYLPHENIDATE 20 MG TAB: 20 | 30 days supply | Qty: 30 | Fill #0

## 2017-06-24 MED FILL — METOPROLOL SUCC ER 25 MG TA: 25 | 90 days supply | Qty: 90 | Fill #3

## 2017-06-24 MED FILL — OMEPRAZOLE DR 20 MG CAPSULE: 20 | 90 days supply | Qty: 180 | Fill #1

## 2017-06-24 MED FILL — LANTUS SOLOSTAR 100 UNITS/M: 100 | 32 days supply | Qty: 18 | Fill #2

## 2017-07-04 MED FILL — UNIFINE PENTIPS 8MM 31G: 31G X 8 MM | 90 days supply | Qty: 100 | Fill #2

## 2017-07-04 MED FILL — AMLODIPINE BESYLATE 5 MG TA: 5 | 90 days supply | Qty: 90 | Fill #3

## 2017-07-12 ENCOUNTER — Encounter: Payer: Self-pay | Admitting: *Deleted

## 2017-07-12 DIAGNOSIS — Z006 Encounter for examination for normal comparison and control in clinical research program: Secondary | ICD-10-CM

## 2017-07-12 NOTE — Progress Notes (Addendum)
TWILIGHT Research study month 9 follow up visit completed. Patient states that he had a "pretty significant hemmorioid bleeding episode that started 04/15/17 and was on and off until 30/APR/2018". He did not seek medical attention but did apply pressure. After pill count he was 100% compliant with both ASA/Placebo and Brilinta. His next research required visit is due no later than 5/FEB/2019. At that visit he will no longer be provided ASA/Placebo or Brilinta. Further antiplatelet therapy will be at the discretion of his cardiologist. Questions were encouraged and answered.

## 2017-07-26 MED FILL — LOSARTAN-HCTZ 100-25 MG TAB: 100-25 | 90 days supply | Qty: 90 | Fill #1

## 2017-07-26 MED FILL — METHYLPHENIDATE 20 MG TAB: 20 | 30 days supply | Qty: 30 | Fill #0

## 2017-07-28 MED FILL — LANTUS SOLOSTAR 100 UNITS/M: 100 | 32 days supply | Qty: 18 | Fill #3

## 2017-08-31 MED FILL — METHYLPHENIDATE 20 MG TAB: 20 | 30 days supply | Qty: 30 | Fill #0

## 2017-08-31 MED FILL — ATORVASTATIN 80 MG TABLET: 80 | 90 days supply | Qty: 90 | Fill #2

## 2017-08-31 MED FILL — LANTUS SOLOSTAR 100 UNITS/M: 100 | 32 days supply | Qty: 18 | Fill #4

## 2017-09-09 DIAGNOSIS — I10 Essential (primary) hypertension: Secondary | ICD-10-CM | POA: Diagnosis not present

## 2017-09-09 DIAGNOSIS — Z8619 Personal history of other infectious and parasitic diseases: Secondary | ICD-10-CM | POA: Diagnosis not present

## 2017-09-09 DIAGNOSIS — E1165 Type 2 diabetes mellitus with hyperglycemia: Secondary | ICD-10-CM | POA: Diagnosis not present

## 2017-09-09 DIAGNOSIS — G63 Polyneuropathy in diseases classified elsewhere: Secondary | ICD-10-CM | POA: Diagnosis not present

## 2017-09-09 DIAGNOSIS — I2510 Atherosclerotic heart disease of native coronary artery without angina pectoris: Secondary | ICD-10-CM | POA: Diagnosis not present

## 2017-09-09 DIAGNOSIS — Z79899 Other long term (current) drug therapy: Secondary | ICD-10-CM | POA: Diagnosis not present

## 2017-09-09 DIAGNOSIS — E7800 Pure hypercholesterolemia, unspecified: Secondary | ICD-10-CM | POA: Diagnosis not present

## 2017-09-09 DIAGNOSIS — R5383 Other fatigue: Secondary | ICD-10-CM | POA: Diagnosis not present

## 2017-09-09 DIAGNOSIS — F909 Attention-deficit hyperactivity disorder, unspecified type: Secondary | ICD-10-CM | POA: Diagnosis not present

## 2017-09-09 DIAGNOSIS — R946 Abnormal results of thyroid function studies: Secondary | ICD-10-CM | POA: Diagnosis not present

## 2017-09-09 DIAGNOSIS — Z0000 Encounter for general adult medical examination without abnormal findings: Secondary | ICD-10-CM | POA: Diagnosis not present

## 2017-09-27 ENCOUNTER — Other Ambulatory Visit: Payer: Self-pay | Admitting: Cardiology

## 2017-09-27 MED FILL — METOPROLOL SUCC ER 25 MG TA: 25 | 90 days supply | Qty: 90 | Fill #0

## 2017-09-27 MED FILL — LANTUS SOLOSTAR 100 UNITS/M: 100 | 32 days supply | Qty: 18 | Fill #5

## 2017-10-06 MED FILL — AMLODIPINE BESYLATE 5 MG TA: 5 | 90 days supply | Qty: 90 | Fill #0

## 2017-10-06 MED FILL — METHYLPHENIDATE 20 MG TAB: 20 | 30 days supply | Qty: 30 | Fill #0

## 2017-10-13 MED FILL — LOSARTAN-HCTZ 100-25 MG TAB: 100-25 | 90 days supply | Qty: 90 | Fill #2

## 2017-10-13 MED FILL — UNIFINE PENTIPS 8MM 31G: 31G X 8 MM | 90 days supply | Qty: 100 | Fill #3

## 2017-10-14 ENCOUNTER — Other Ambulatory Visit: Payer: Self-pay | Admitting: Nurse Practitioner

## 2017-10-14 DIAGNOSIS — K7460 Unspecified cirrhosis of liver: Secondary | ICD-10-CM

## 2017-11-02 MED FILL — LANTUS SOLOSTAR 100 UNITS/M: 100 | 32 days supply | Qty: 18 | Fill #6

## 2017-11-03 MED FILL — METHYLPHENIDATE 20 MG TAB: 20 | 30 days supply | Qty: 30 | Fill #0

## 2017-12-01 ENCOUNTER — Other Ambulatory Visit: Payer: Self-pay | Admitting: Cardiology

## 2017-12-01 MED FILL — LANTUS SOLOSTAR 100 UNITS/M: 100 | 32 days supply | Qty: 18 | Fill #7

## 2017-12-02 MED FILL — ATORVASTATIN 80 MG TABLET: 80 | 30 days supply | Qty: 30 | Fill #0

## 2017-12-21 MED FILL — METHYLPHENIDATE 20 MG TAB: 20 | 30 days supply | Qty: 30 | Fill #0

## 2017-12-21 MED FILL — METOPROLOL SUCC ER 25 MG TA: 25 | 90 days supply | Qty: 90 | Fill #1

## 2018-01-02 MED FILL — ATORVASTATIN 80 MG TABLET: 80 | 30 days supply | Qty: 30 | Fill #1

## 2018-01-02 MED FILL — LANTUS SOLOSTAR 100 UNITS/M: 100 | 32 days supply | Qty: 18 | Fill #8

## 2018-01-02 MED FILL — OMEPRAZOLE 20 MG CAP: 20 | 90 days supply | Qty: 180 | Fill #0

## 2018-01-04 ENCOUNTER — Encounter: Payer: Self-pay | Admitting: *Deleted

## 2018-01-04 DIAGNOSIS — Z006 Encounter for examination for normal comparison and control in clinical research program: Secondary | ICD-10-CM

## 2018-01-04 MED ORDER — ASPIRIN EC 81 MG PO TBEC: 81.0000 mg | DELAYED_RELEASE_TABLET | Freq: Every day | ORAL | 3 refills | Status: AC

## 2018-01-04 NOTE — Progress Notes (Signed)
TWILIGHT Research study month 15 (end of treatment) visit completed. Patient did experience scant bleeding from hemorrhoids during the holidays. Nothing that he had to seek medical treatment. He denies any other bleeding episodes. He has been 98 % compliant with  ASA/Placebo and 99 % with Brilinta. He has been prescribed to start ASA 81 mg daily. He will start this tomorrow because he has taken his study drug this morning. Questions encouraged and answered. He has 1 remaining research telephone follow up due before 20/Apr/2019.

## 2018-01-10 MED FILL — AMLODIPINE BESYLATE 5 MG TA: 5 | 90 days supply | Qty: 90 | Fill #1

## 2018-01-10 MED FILL — UNIFINE PENTIPS 8MM 31G: 31G X 8 MM | 90 days supply | Qty: 100 | Fill #0

## 2018-01-25 MED FILL — ATORVASTATIN 80 MG TABLET: 80 | 30 days supply | Qty: 30 | Fill #2

## 2018-01-25 MED FILL — LOSARTAN-HCTZ 100-25 MG TAB: 100-25 | 90 days supply | Qty: 90 | Fill #0

## 2018-01-27 MED FILL — METHYLPHENIDATE 20 MG TAB: 20 | 30 days supply | Qty: 30 | Fill #0

## 2018-02-01 MED FILL — LANTUS SOLOSTAR 100 UNITS/M: 100 | 30 days supply | Qty: 18 | Fill #0

## 2018-03-03 MED FILL — ATORVASTATIN 80 MG TABLET: 80 | 30 days supply | Qty: 30 | Fill #3

## 2018-03-07 MED FILL — LANTUS SOLOSTAR 100 UNITS/M: 100 | 30 days supply | Qty: 18 | Fill #1

## 2018-03-21 ENCOUNTER — Telehealth: Payer: Self-pay | Admitting: *Deleted

## 2018-03-21 NOTE — Telephone Encounter (Signed)
TWILIGHT Research study month 18 telephone call attempted. Left message for patient to call the research office.

## 2018-03-24 ENCOUNTER — Encounter: Payer: Self-pay | Admitting: *Deleted

## 2018-03-24 DIAGNOSIS — Z006 Encounter for examination for normal comparison and control in clinical research program: Secondary | ICD-10-CM

## 2018-03-24 MED FILL — METOPROLOL SUCCINATE ER 25: 25 | 90 days supply | Qty: 90 | Fill #2

## 2018-03-24 MED FILL — METHYLPHENIDATE 20 MG TAB: 20 | 30 days supply | Qty: 60 | Fill #0

## 2018-03-24 NOTE — Progress Notes (Signed)
TWILIGHT Research study month 18 telephone follow up completed. Patient continues on ASA and denies any bleeding or other adverse events. I thanked him for his participation.

## 2018-04-05 MED FILL — ATORVASTATIN 80 MG TABLET: 80 | 90 days supply | Qty: 90 | Fill #4

## 2018-04-05 MED FILL — LANTUS SOLOSTAR 100 UNITS/M: 100 | 30 days supply | Qty: 18 | Fill #2

## 2018-04-05 MED FILL — UNIFINE PENTIPS 8MM 31G: 31G X 8 MM | 90 days supply | Qty: 100 | Fill #1

## 2018-04-17 MED FILL — AMLODIPINE BESYLATE 5 MG TA: 5 | 90 days supply | Qty: 90 | Fill #2

## 2018-04-27 MED FILL — LOSARTAN-HCTZ 100-25 MG TAB: 100-25 | 90 days supply | Qty: 90 | Fill #1

## 2018-05-08 MED FILL — LANTUS SOLOSTAR 100 UNITS/M: 100 | 30 days supply | Qty: 18 | Fill #3

## 2018-06-07 MED FILL — LANTUS SOLOSTAR 100 UNITS/M: 100 | 30 days supply | Qty: 18 | Fill #4

## 2018-06-15 MED FILL — METHYLPHENIDATE 20 MG TAB: 20 | 30 days supply | Qty: 60 | Fill #0

## 2018-06-29 ENCOUNTER — Other Ambulatory Visit: Payer: Self-pay | Admitting: Cardiology

## 2018-06-29 MED FILL — METOPROLOL SUCCINATE ER 25: 25 | 90 days supply | Qty: 90 | Fill #3

## 2018-06-29 MED FILL — ATORVASTATIN 80 MG TABLET: 80 | 30 days supply | Qty: 30 | Fill #0

## 2018-06-29 MED FILL — OMEPRAZOLE 20 MG CAP: 20 | 90 days supply | Qty: 180 | Fill #1

## 2018-07-04 MED FILL — LANTUS SOLOSTAR 100 UNITS/M: 100 | 30 days supply | Qty: 18 | Fill #5

## 2018-07-17 MED FILL — UNIFINE PENTIPS 8MM 31G: 31G X 8 MM | 90 days supply | Qty: 100 | Fill #2

## 2018-07-18 MED FILL — AMLODIPINE BESYLATE 5 MG TA: 5 | 90 days supply | Qty: 90 | Fill #0

## 2018-07-31 MED FILL — ATORVASTATIN 80 MG TABLET: 80 | 30 days supply | Qty: 30 | Fill #1

## 2018-07-31 MED FILL — LOSARTAN-HCTZ 100-25 MG TAB: 100-25 | 90 days supply | Qty: 90 | Fill #2

## 2018-08-09 MED FILL — LANTUS SOLOSTAR 100 UNITS/M: 100 | 32 days supply | Qty: 18 | Fill #0

## 2018-09-04 ENCOUNTER — Other Ambulatory Visit: Payer: Self-pay | Admitting: Cardiology

## 2018-09-04 MED FILL — ATORVASTATIN 80 MG TABLET: 80 | 30 days supply | Qty: 30 | Fill #0

## 2018-09-04 MED FILL — METHYLPHENIDATE 20 MG TAB: 20 | 30 days supply | Qty: 60 | Fill #0

## 2018-09-06 MED FILL — LANTUS SOLOSTAR 100 UNITS/M: 100 | 32 days supply | Qty: 18 | Fill #1

## 2018-09-19 ENCOUNTER — Other Ambulatory Visit: Payer: Self-pay | Admitting: Cardiology

## 2018-09-20 MED FILL — METOPROLOL SUCCINATE ER 25: 25 | 90 days supply | Qty: 90 | Fill #0

## 2018-10-05 ENCOUNTER — Other Ambulatory Visit: Payer: Self-pay | Admitting: Cardiology

## 2018-10-05 MED FILL — ATORVASTATIN 80 MG TABLET: 80 | 30 days supply | Qty: 30 | Fill #0

## 2018-10-06 MED FILL — LANTUS SOLOSTAR 100 UNITS/M: 100 | 10 days supply | Qty: 6 | Fill #2

## 2018-10-20 MED FILL — AMLODIPINE BESYLATE 5 MG TA: 5 | 90 days supply | Qty: 90 | Fill #0

## 2018-10-20 MED FILL — LANTUS SOLOSTAR 100 UNITS/M: 100 | 10 days supply | Qty: 6 | Fill #3

## 2018-10-26 MED FILL — LOSARTAN-HCTZ 100-25 MG TAB: 100-25 | 30 days supply | Qty: 30 | Fill #3

## 2018-10-26 MED FILL — UNIFINE PENTIPS 8MM 31G: 31G X 8 MM | 30 days supply | Qty: 100 | Fill #3

## 2018-10-26 MED FILL — ATORVASTATIN 80 MG TABLET: 80 | 30 days supply | Qty: 30 | Fill #1

## 2018-10-27 MED FILL — LANTUS SOLOSTAR 100 UNITS/M: 100 | 30 days supply | Qty: 18 | Fill #0

## 2018-11-02 MED FILL — METHYLPHENIDATE 20 MG TAB: 20 | 30 days supply | Qty: 60 | Fill #0

## 2018-11-27 MED FILL — LANTUS SOLOSTAR 100 UNITS/M: 100 | 30 days supply | Qty: 18 | Fill #1

## 2018-11-29 MED FILL — LOSARTAN POTASSIUM 100 MG T: 100 | 30 days supply | Qty: 30 | Fill #0

## 2018-11-29 MED FILL — HYDROCHLOROTHIAZIDE 25 MG T: 25 | 30 days supply | Qty: 30 | Fill #0

## 2018-12-01 ENCOUNTER — Other Ambulatory Visit: Payer: Self-pay | Admitting: Cardiology

## 2018-12-01 MED FILL — ATORVASTATIN 80 MG TABLET: 80 | 30 days supply | Qty: 30 | Fill #0

## 2018-12-22 MED FILL — HYDROCHLOROTHIAZIDE 25 MG T: 25 | 30 days supply | Qty: 30 | Fill #1

## 2018-12-22 MED FILL — LOSARTAN POTASSIUM 100 MG T: 100 | 30 days supply | Qty: 30 | Fill #1

## 2018-12-22 MED FILL — METHYLPHENIDATE 20 MG TAB: 20 | 30 days supply | Qty: 60 | Fill #0

## 2018-12-22 MED FILL — METOPROLOL SUCCINATE ER 25: 25 | 30 days supply | Qty: 30 | Fill #1

## 2018-12-25 MED FILL — LANTUS SOLOSTAR 100 UNITS/M: 100 | 25 days supply | Qty: 15 | Fill #0

## 2018-12-29 MED FILL — NovoLOG 100 UNIT/ML SOLN: 100 | 16 days supply | Qty: 10 | Fill #0

## 2019-01-01 MED FILL — ATORVASTATIN 80 MG TABLET: 80 | 90 days supply | Qty: 90 | Fill #0

## 2019-01-01 MED FILL — OMEPRAZOLE 20 MG CPDR: 20 | 90 days supply | Qty: 180 | Fill #2

## 2019-01-17 MED FILL — AMLODIPINE BESYLATE 5 MG TA: 5 | 30 days supply | Qty: 30 | Fill #1

## 2019-01-25 MED FILL — LOSARTAN POTASSIUM 100 MG T: 100 | 30 days supply | Qty: 30 | Fill #2

## 2019-01-25 MED FILL — LANTUS SOLOSTAR 100 UNITS/M: 100 | 25 days supply | Qty: 15 | Fill #1

## 2019-01-25 MED FILL — METOPROLOL SUCCINATE ER 25: 25 | 30 days supply | Qty: 30 | Fill #2

## 2019-01-25 MED FILL — HYDROCHLOROTHIAZIDE 25 MG T: 25 | 30 days supply | Qty: 30 | Fill #2

## 2019-01-25 MED FILL — METHYLPHENIDATE 20 MG TAB: 20 | 30 days supply | Qty: 60 | Fill #0

## 2019-02-12 MED FILL — UNIFINE PENTIPS 8MM 31G: 31G X 8 MM | 30 days supply | Qty: 100 | Fill #0 | Status: TO

## 2019-02-19 MED FILL — LANTUS SOLOSTAR 100 UNITS/M: 100 | 25 days supply | Qty: 15 | Fill #2 | Status: TO

## 2019-02-19 MED FILL — AMLODIPINE BESYLATE 5 MG TA: 5 | 30 days supply | Qty: 30 | Fill #2 | Status: TO

## 2019-02-19 MED FILL — METOPROLOL SUCCINATE ER 25: 25 | 30 days supply | Qty: 30 | Fill #3

## 2019-02-26 MED FILL — METHYLPHENIDATE 20 MG TAB: 20 | 30 days supply | Qty: 60 | Fill #0

## 2019-02-26 MED FILL — HYDROCHLOROTHIAZIDE 25 MG T: 25 | 30 days supply | Qty: 30 | Fill #0 | Status: TO

## 2019-02-26 MED FILL — LOSARTAN POTASSIUM 100 MG T: 100 | 30 days supply | Qty: 30 | Fill #0 | Status: TO

## 2019-02-26 MED FILL — NovoLOG 100 UNIT/ML SOLN: 100 | 16 days supply | Qty: 10 | Fill #1 | Status: TO

## 2019-03-19 ENCOUNTER — Other Ambulatory Visit: Payer: Self-pay | Admitting: Cardiology

## 2019-03-19 MED FILL — LOSARTAN POTASSIUM 100 MG T: 100 | 30 days supply | Qty: 30 | Fill #0

## 2019-03-19 MED FILL — HYDROCHLOROTHIAZIDE 25 MG T: 25 | 30 days supply | Qty: 30 | Fill #0

## 2019-03-19 MED FILL — AMLODIPINE BESYLATE 5 MG TA: 5 | 30 days supply | Qty: 30 | Fill #0

## 2019-03-19 MED FILL — METOPROLOL SUCCINATE ER 25: 25 | 30 days supply | Qty: 30 | Fill #0

## 2019-03-19 MED FILL — LANTUS SOLOSTAR 100 UNITS/M: 100 | 25 days supply | Qty: 15 | Fill #0

## 2019-04-02 MED FILL — ATORVASTATIN 80 MG TABLET: 80 | 30 days supply | Qty: 30 | Fill #0

## 2019-04-03 MED FILL — METHYLPHENIDATE 20 MG TAB: 20 | 30 days supply | Qty: 60 | Fill #0

## 2019-04-17 MED FILL — AMLODIPINE BESYLATE 5 MG TA: 5 | 30 days supply | Qty: 30 | Fill #1

## 2019-04-17 MED FILL — LANTUS SOLOSTAR 100 UNITS/M: 100 | 25 days supply | Qty: 15 | Fill #1

## 2019-04-24 MED FILL — METOPROLOL SUCCINATE ER 25: 25 | 30 days supply | Qty: 30 | Fill #1

## 2019-04-26 MED FILL — HYDROCHLOROTHIAZIDE 25 MG T: 25 | 30 days supply | Qty: 30 | Fill #1

## 2019-04-26 MED FILL — LOSARTAN POTASSIUM 100 MG T: 100 | 30 days supply | Qty: 30 | Fill #1

## 2019-04-27 MED FILL — UNIFINE PENTIPS 8MM 31G: 31G X 8 MM | 30 days supply | Qty: 100 | Fill #0

## 2019-05-01 MED FILL — ATORVASTATIN 80 MG TABLET: 80 | 30 days supply | Qty: 30 | Fill #1

## 2019-05-07 MED FILL — NovoLOG 100 UNIT/ML SOLN: 100 | 16 days supply | Qty: 10 | Fill #0

## 2019-05-17 MED FILL — AMLODIPINE BESYLATE 5 MG TA: 5 | 30 days supply | Qty: 30 | Fill #2

## 2019-05-17 MED FILL — LANTUS SOLOSTAR 100 UNITS/M: 100 | 25 days supply | Qty: 15 | Fill #2

## 2019-05-17 MED FILL — METHYLPHENIDATE 20 MG TAB: 20 | 30 days supply | Qty: 60 | Fill #0

## 2019-05-22 MED FILL — METOPROLOL SUCCINATE ER 25: 25 | 30 days supply | Qty: 30 | Fill #2

## 2019-05-27 MED FILL — HYDROCHLOROTHIAZIDE 25 MG T: 25 | 30 days supply | Qty: 30 | Fill #2

## 2019-05-28 MED FILL — LOSARTAN POTASSIUM 100 MG T: 100 | 30 days supply | Qty: 30 | Fill #2

## 2019-05-31 MED FILL — ATORVASTATIN 80 MG TABLET: 80 | 30 days supply | Qty: 30 | Fill #2

## 2019-06-05 MED FILL — NovoLOG 100 UNIT/ML SOLN: 100 | 16 days supply | Qty: 10 | Fill #0

## 2019-06-05 MED FILL — LANTUS SOLOSTAR 100 UNITS/M: 100 | 30 days supply | Qty: 18 | Fill #0

## 2019-06-05 MED FILL — OMEPRAZOLE 20 MG CPDR: 20 | 30 days supply | Qty: 60 | Fill #0

## 2019-06-14 MED FILL — METOPROLOL SUCCINATE ER 25: 25 | 30 days supply | Qty: 30 | Fill #0

## 2019-06-18 MED FILL — AMLODIPINE BESYLATE 5 MG TA: 5 | 30 days supply | Qty: 30 | Fill #3

## 2019-06-28 MED FILL — HYDROCHLOROTHIAZIDE 25 MG T: 25 | 30 days supply | Qty: 30 | Fill #3

## 2019-06-28 MED FILL — LOSARTAN POTASSIUM 100 MG T: 100 | 30 days supply | Qty: 30 | Fill #3

## 2019-06-28 MED FILL — ATORVASTATIN 80 MG TABLET: 80 | 30 days supply | Qty: 30 | Fill #3

## 2019-07-04 ENCOUNTER — Telehealth: Payer: Self-pay | Admitting: Cardiology

## 2019-07-04 NOTE — Telephone Encounter (Signed)
Lm about recall

## 2019-07-09 MED FILL — AMOXICILLIN 500 MG CAPSULE: 500 | 7 days supply | Qty: 21 | Fill #0

## 2019-07-20 MED FILL — LANTUS SOLOSTAR 100 UNITS/M: 100 | 30 days supply | Qty: 18 | Fill #1

## 2019-07-20 MED FILL — METHYLPHENIDATE 20 MG TAB: 20 | 30 days supply | Qty: 60 | Fill #0

## 2019-07-20 MED FILL — AMLODIPINE BESYLATE 5 MG TA: 5 | 30 days supply | Qty: 30 | Fill #4

## 2019-07-20 MED FILL — METOPROLOL SUCCINATE ER 25: 25 | 30 days supply | Qty: 30 | Fill #1

## 2019-07-27 MED FILL — LOSARTAN POTASSIUM 100 MG T: 100 | 30 days supply | Qty: 30 | Fill #4

## 2019-07-27 MED FILL — UNIFINE PENTIPS 8MM 31G: 31G X 8 MM | 30 days supply | Qty: 100 | Fill #1

## 2019-07-27 MED FILL — ATORVASTATIN 80 MG TABLET: 80 | 30 days supply | Qty: 30 | Fill #4

## 2019-07-27 MED FILL — HYDROCHLOROTHIAZIDE 25 MG T: 25 | 30 days supply | Qty: 30 | Fill #4

## 2019-08-19 MED FILL — AMLODIPINE BESYLATE 5 MG TA: 5 | 30 days supply | Qty: 30 | Fill #5

## 2019-08-24 MED FILL — LOSARTAN POTASSIUM 100 MG T: 100 | 30 days supply | Qty: 30 | Fill #5

## 2019-08-24 MED FILL — LANTUS SOLOSTAR 100 UNITS/M: 100 | 30 days supply | Qty: 18 | Fill #2

## 2019-08-24 MED FILL — HYDROCHLOROTHIAZIDE 25 MG T: 25 | 30 days supply | Qty: 30 | Fill #5

## 2019-08-24 MED FILL — METOPROLOL SUCCINATE ER 25: 25 | 30 days supply | Qty: 30 | Fill #2

## 2019-08-31 MED FILL — ATORVASTATIN 80 MG TABLET: 80 | 30 days supply | Qty: 30 | Fill #5

## 2019-08-31 MED FILL — OMEPRAZOLE 20 MG CAP: 20 | 30 days supply | Qty: 60 | Fill #1

## 2019-08-31 MED FILL — METHYLPHENIDATE HCL 20 MG T: 20 | 30 days supply | Qty: 60 | Fill #0

## 2019-09-20 MED FILL — AMLODIPINE BESYLATE 5 MG TA: 5 | 30 days supply | Qty: 30 | Fill #6

## 2019-09-20 MED FILL — NovoLOG 100 UNIT/ML SOLN: 100 | 16 days supply | Qty: 10 | Fill #1

## 2019-09-20 MED FILL — METOPROLOL SUCCINATE ER 25: 25 | 30 days supply | Qty: 30 | Fill #3

## 2019-09-27 MED FILL — LANTUS SOLOSTAR 100 UNITS/M: 100 | 30 days supply | Qty: 18 | Fill #3

## 2019-09-27 MED FILL — HYDROCHLOROTHIAZIDE 25 MG T: 25 | 30 days supply | Qty: 30 | Fill #6

## 2019-09-27 MED FILL — LOSARTAN POTASSIUM 100 MG T: 100 | 30 days supply | Qty: 30 | Fill #6

## 2019-09-28 MED FILL — ATORVASTATIN 80 MG TABLET: 80 | 30 days supply | Qty: 30 | Fill #6

## 2019-10-12 MED FILL — MELOXICAM 7.5 MG TABLET: 7.5 | 30 days supply | Qty: 30 | Fill #0

## 2019-10-19 MED FILL — METHYLPHENIDATE HCL 20 MG T: 20 | 30 days supply | Qty: 60 | Fill #0

## 2019-10-19 MED FILL — METOPROLOL SUCCINATE ER 25: 25 | 30 days supply | Qty: 30 | Fill #4

## 2019-10-22 MED FILL — AMLODIPINE BESYLATE 5 MG TA: 5 | 30 days supply | Qty: 30 | Fill #0

## 2019-10-25 MED FILL — HYDROCHLOROTHIAZIDE 25 MG T: 25 | 30 days supply | Qty: 30 | Fill #7

## 2019-10-25 MED FILL — LOSARTAN POTASSIUM 100 MG T: 100 | 30 days supply | Qty: 30 | Fill #7

## 2019-10-29 MED FILL — ATORVASTATIN 80 MG TABLET: 80 | 30 days supply | Qty: 30 | Fill #7

## 2019-10-29 MED FILL — OMEPRAZOLE 20 MG CAP: 20 | 30 days supply | Qty: 60 | Fill #2

## 2019-11-09 MED FILL — LANTUS SOLOSTAR 100 UNITS/M: 100 | 30 days supply | Qty: 18 | Fill #4

## 2019-11-14 MED FILL — MELOXICAM 7.5 MG TABLET: 7.5 | 30 days supply | Qty: 30 | Fill #1

## 2019-11-14 MED FILL — METOPROLOL SUCCINATE ER 25: 25 | 30 days supply | Qty: 30 | Fill #5

## 2019-11-14 MED FILL — AMLODIPINE BESYLATE 5 MG TA: 5 | 30 days supply | Qty: 30 | Fill #1

## 2019-11-25 MED FILL — UNIFINE PENTIPS 8MM 31G: 31G X 8 MM | 30 days supply | Qty: 100 | Fill #2

## 2019-11-25 MED FILL — HYDROCHLOROTHIAZIDE 25 MG T: 25 | 30 days supply | Qty: 30 | Fill #8

## 2019-11-25 MED FILL — LOSARTAN POTASSIUM 100 MG T: 100 | 30 days supply | Qty: 30 | Fill #8

## 2019-11-26 MED FILL — METHYLPHENIDATE HCL 20 MG T: 20 | 30 days supply | Qty: 60 | Fill #0

## 2019-11-26 MED FILL — NovoLOG 100 UNIT/ML SOLN: 100 | 16 days supply | Qty: 10 | Fill #1

## 2019-11-26 MED FILL — ATORVASTATIN 80 MG TABLET: 80 | 30 days supply | Qty: 30 | Fill #8

## 2019-12-12 MED FILL — LANTUS SOLOSTAR 100 UNITS/M: 100 | 30 days supply | Qty: 18 | Fill #5

## 2019-12-24 ENCOUNTER — Other Ambulatory Visit: Payer: Self-pay | Admitting: Internal Medicine

## 2019-12-24 DIAGNOSIS — M722 Plantar fascial fibromatosis: Secondary | ICD-10-CM | POA: Diagnosis not present

## 2019-12-24 DIAGNOSIS — Z8619 Personal history of other infectious and parasitic diseases: Secondary | ICD-10-CM | POA: Diagnosis not present

## 2019-12-24 DIAGNOSIS — I10 Essential (primary) hypertension: Secondary | ICD-10-CM | POA: Diagnosis not present

## 2019-12-24 DIAGNOSIS — M170 Bilateral primary osteoarthritis of knee: Secondary | ICD-10-CM | POA: Diagnosis not present

## 2019-12-24 DIAGNOSIS — E7800 Pure hypercholesterolemia, unspecified: Secondary | ICD-10-CM | POA: Diagnosis not present

## 2019-12-24 DIAGNOSIS — I2510 Atherosclerotic heart disease of native coronary artery without angina pectoris: Secondary | ICD-10-CM | POA: Diagnosis not present

## 2019-12-24 DIAGNOSIS — E1165 Type 2 diabetes mellitus with hyperglycemia: Secondary | ICD-10-CM | POA: Diagnosis not present

## 2019-12-24 DIAGNOSIS — K219 Gastro-esophageal reflux disease without esophagitis: Secondary | ICD-10-CM | POA: Diagnosis not present

## 2019-12-24 DIAGNOSIS — F321 Major depressive disorder, single episode, moderate: Secondary | ICD-10-CM | POA: Diagnosis not present

## 2019-12-24 DIAGNOSIS — F909 Attention-deficit hyperactivity disorder, unspecified type: Secondary | ICD-10-CM | POA: Diagnosis not present

## 2019-12-24 MED FILL — LANTUS SOLOSTAR 100 UNITS/M: 100 | 35 days supply | Qty: 21 | Fill #0

## 2019-12-24 MED FILL — ATORVASTATIN 80 MG TABLET: 80 | 30 days supply | Qty: 30 | Fill #0

## 2019-12-24 MED FILL — AMLODIPINE BESYLATE 5 MG TA: 5 | 30 days supply | Qty: 30 | Fill #0

## 2019-12-24 MED FILL — METHYLPHENIDATE HCL 20 MG T: 20 | 30 days supply | Qty: 60 | Fill #0

## 2019-12-24 MED FILL — METOPROLOL SUCCINATE ER 25: 25 | 30 days supply | Qty: 30 | Fill #0

## 2019-12-24 MED FILL — HYDROCHLOROTHIAZIDE 25 MG T: 25 | 30 days supply | Qty: 30 | Fill #0

## 2019-12-24 MED FILL — LOSARTAN POTASSIUM 100 MG T: 100 | 30 days supply | Qty: 30 | Fill #0

## 2019-12-24 MED FILL — OMEPRAZOLE 20 MG CAP: 20 | 90 days supply | Qty: 180 | Fill #0

## 2020-01-04 ENCOUNTER — Other Ambulatory Visit: Payer: Self-pay

## 2020-01-21 MED FILL — AMLODIPINE BESYLATE 5 MG TA: 5 | 30 days supply | Qty: 30 | Fill #1

## 2020-01-21 MED FILL — METOPROLOL SUCCINATE ER 25: 25 | 30 days supply | Qty: 30 | Fill #1

## 2020-01-24 MED FILL — HYDROCHLOROTHIAZIDE 25 MG T: 25 | 30 days supply | Qty: 30 | Fill #1

## 2020-01-24 MED FILL — LOSARTAN POTASSIUM 100 MG T: 100 | 30 days supply | Qty: 30 | Fill #1

## 2020-01-24 MED FILL — ATORVASTATIN 80 MG TABLET: 80 | 30 days supply | Qty: 30 | Fill #1

## 2020-02-08 ENCOUNTER — Other Ambulatory Visit: Payer: Self-pay

## 2020-02-08 MED FILL — METHYLPHENIDATE HCL 20 MG T: 20 | 30 days supply | Qty: 60 | Fill #0

## 2020-02-08 MED FILL — INSULIN ASPART 100 UNIT/ML: 100 | 16 days supply | Qty: 10 | Fill #2

## 2020-02-08 NOTE — Patient Outreach (Signed)
  [facility]) Care Management Chronic Special Needs Program    02/08/2020  Name: Eddie Mullins, DOB: [birth year]  MRN: [id number]   [name] is enrolled in a chronic special needs plan for Diabetes. Telephone call to client for health risk assessment completion / review. Unable to reach. HIPAA compliant voice message left with call back phone number and return call request.   PLAN; RNCM will attempt 2nd telephone call to client within 1 week    .  Quinn Plowman RN,BSN,CCM [place] [facility] [phone]

## 2020-02-11 ENCOUNTER — Other Ambulatory Visit: Payer: Self-pay

## 2020-02-11 DIAGNOSIS — I10 Essential (primary) hypertension: Secondary | ICD-10-CM | POA: Insufficient documentation

## 2020-02-11 NOTE — Patient Outreach (Addendum)
[facility]) Care Management Chronic Special Needs Program  02/11/2020  Name: Eddie Mullins DOB: 1956-03-03  MRN: [id number]  [name] is enrolled in a chronic special needs plan for Diabetes. Chronic Care Management Coordinator telephoned client to review health risk assessment and to develop individualized care plan.  Introduced the chronic care management program, importance of client participation, and taking their care plan to all provider appointments and inpatient facilities.    Subjective: Client states he has been diabetic for approximately 10 years. He state he sees Dr. Inda Mullins his primary provider for diabetic follow up. Client states his most recent Hgb A1c from 12/24/19 was 7.3.  Client reports his blood sugars range from 58-210. Client states, " I am not concerned about the low blood sugars. I know how to manage them."  Client states his doctor is aware that he has previously had low blood sugars. Client states a low blood sugar reading only occurs 1 time per month. Client states he expects to have an occasional blood sugar due to exercising regularly.  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 Client states his glucometer is approximately 66 years old. He states he would like to try another method of monitoring his blood sugars other than daily finger sticks. RNCM discussed Free style libre with client. Advised client to contact his HTA concierge regarding coverage options for glucometer/ free style libre. Client verbalized understanding.  Client reports history of hepatitis C at age of 64. 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   Goals Addressed            This Visit's Progress   . Client understands the importance of follow-up with providers by attending scheduled visits       Client reports adherence to provider appointments.  Last follow up appointment with primary care provider 12/24/19 Schedule regular follow ups and  physical with provider as recommended.     . Client will report no worsening of symptoms related to heart disease within the next 9 months       RN case manager mailed client education article on Heart disease: Heart Disease in diabetics    . Client will report speaking to his HTA concierge about acquiring an new glucometer within 9 months       RN case manager advised client to contact his Health team advantage concierge to discuss coverage of a new glucometer RN Case manager discussed the [employer] with client.     . Client will verbalize knowledge of self management of Hypertension as evidences by BP reading of 140/90 or less; or as defined by provider       Assessed high blood pressure self management skills Mailed client education article: High blood pressure:What you can do Continue to take medications as prescribed.  Contact your provider for questions or concerns Please as your provider, " What is my target blood pressure range."  Follow up with provider as recommended Monitor your blood pressure and take results to your doctor appointment     . HEMOGLOBIN A1C < 7.0       Discussed diabetes self management actions:  Glucose monitoring per provider recommendation  Visit provider every 3-6 months as directed  Hbg A1C level every 3-6 months.  Carbohydrate controlled meal planning  Taking diabetes medication as prescribed by provider  Physical activity     . Maintain timely refills of diabetic medication as prescribed within the year .  Take medications as prescribed Follow up with your doctor if you have questions Contact your assigned RN case manager if you have difficulty obtaining your medications    . Obtain annual  Lipid Profile, LDL-C       The goal for LDL is less than 70 mg/ dl:  Avoid saturated fats, trans fats, and eat more fiber.    . Obtain Annual Eye (retinal)  Exam        Advised to schedule your eye exam yearly    . Obtain Annual Foot Exam        Your doctor should check your feet at least once a year    . Obtain annual screen for micro albuminuria (urine) , nephropathy (kidney problems)       Attend yealy physicals and follow up visits with your provider and complete labs as recommended.  It is important for your doctor to check your urine for protein at least every year    . Obtain Hemoglobin A1C at least 2 times per year       Last Hgb A1c completed 12/24/19 Advised to consistently follow up with provider for exams/ labs    . Patient will verbalize A1c in goal range of 7.0 within 9 months (pt-stated)       Glucose monitoring per provider recommendation Take diabetic medications as prescribed by provider. Physical activity Visit provider every 3-6 months as directed Hgb A1c level checked every 3-6 months Carbohydrate controlled meal planning    . Visit Primary Care Provider or Endocrinologist at least 2 times per year        Please call and scheduled yearly physical with provider.  Last noted physical with primary care provider 12/24/19      Assessment: Client is not meeting diabetes self-management goal of hemoglobin A1C of <7.0% with most recent reading of 7.3% on 12/24/19 with reports of occasional hypoglycemia . Client states he is aware of how to manage his blood sugar if it is low.  Client confirms he received 2 COVID vaccines.  Client has good understanding of:  COVID-19 cause, symptoms, precautions (social distancing, stay at home order, hand washing, wear face covering when unable to maintain or ensure 6 foot social distancing), and symptoms requiring provider notification.  Plan:  Send successful outreach letter with a copy of their individualized care plan, Send individual care plan to provider and Send educational material:  High blood pressure: What you can do and Heart disease in diabetics  Chronic care management coordination will outreach in:  9 Months     Eddie Plowman RN,BSN,CCM [place] [facility] [phone]

## 2020-02-18 DIAGNOSIS — H903 Sensorineural hearing loss, bilateral: Secondary | ICD-10-CM | POA: Diagnosis not present

## 2020-02-18 MED FILL — AMLODIPINE BESYLATE 5 MG TA: 5 | 30 days supply | Qty: 30 | Fill #2

## 2020-02-18 MED FILL — UNIFINE PENTIPS 8MM 31G: 31G X 8 MM | 30 days supply | Qty: 100 | Fill #0

## 2020-02-18 MED FILL — METOPROLOL SUCCINATE ER 25: 25 | 30 days supply | Qty: 30 | Fill #2

## 2020-02-25 DIAGNOSIS — H11002 Unspecified pterygium of left eye: Secondary | ICD-10-CM | POA: Diagnosis not present

## 2020-02-25 DIAGNOSIS — E119 Type 2 diabetes mellitus without complications: Secondary | ICD-10-CM | POA: Diagnosis not present

## 2020-02-25 DIAGNOSIS — H524 Presbyopia: Secondary | ICD-10-CM | POA: Diagnosis not present

## 2020-02-25 DIAGNOSIS — H2513 Age-related nuclear cataract, bilateral: Secondary | ICD-10-CM | POA: Diagnosis not present

## 2020-02-25 MED FILL — HYDROCHLOROTHIAZIDE 25 MG T: 25 | 30 days supply | Qty: 30 | Fill #2

## 2020-02-25 MED FILL — LOSARTAN POTASSIUM 100 MG T: 100 | 30 days supply | Qty: 30 | Fill #2

## 2020-03-10 MED FILL — METHYLPHENIDATE HCL 20 MG T: 20 | 30 days supply | Qty: 60 | Fill #0

## 2020-03-10 MED FILL — LANTUS SOLOSTAR 100 UNITS/M: 100 | 35 days supply | Qty: 21 | Fill #1

## 2020-03-21 MED FILL — LOSARTAN POTASSIUM 100 MG T: 100 | 30 days supply | Qty: 30 | Fill #3

## 2020-03-21 MED FILL — AMLODIPINE BESYLATE 5 MG TA: 5 | 30 days supply | Qty: 30 | Fill #3

## 2020-03-21 MED FILL — HYDROCHLOROTHIAZIDE 25 MG T: 25 | 30 days supply | Qty: 30 | Fill #3

## 2020-03-21 MED FILL — METOPROLOL SUCCINATE ER 25: 25 | 30 days supply | Qty: 30 | Fill #3

## 2020-04-21 MED FILL — METOPROLOL SUCCINATE ER 25: 25 | 30 days supply | Qty: 30 | Fill #4

## 2020-04-21 MED FILL — LOSARTAN POTASSIUM 100 MG T: 100 | 30 days supply | Qty: 30 | Fill #4

## 2020-04-21 MED FILL — AMLODIPINE BESYLATE 5 MG TA: 5 | 30 days supply | Qty: 30 | Fill #4

## 2020-04-21 MED FILL — UNIFINE PENTIPS 8MM 31G: 31G X 8 MM | 30 days supply | Qty: 100 | Fill #1

## 2020-04-21 MED FILL — LANTUS SOLOSTAR 100 UNITS/M: 100 | 35 days supply | Qty: 21 | Fill #2

## 2020-04-21 MED FILL — HYDROCHLOROTHIAZIDE 25 MG T: 25 | 30 days supply | Qty: 30 | Fill #4

## 2020-04-22 MED FILL — METHYLPHENIDATE 20 MG TAB: 20 | 30 days supply | Qty: 60 | Fill #0

## 2020-05-05 MED FILL — INSULIN ASPART 100 UNIT/ML: 100 | 16 days supply | Qty: 10 | Fill #3

## 2020-05-20 MED FILL — AMLODIPINE BESYLATE 5 MG TA: 5 | 30 days supply | Qty: 30 | Fill #5

## 2020-05-20 MED FILL — METOPROLOL SUCCINATE ER 25: 25 | 30 days supply | Qty: 30 | Fill #5

## 2020-05-20 MED FILL — HYDROCHLOROTHIAZIDE 25 MG T: 25 | 30 days supply | Qty: 30 | Fill #5

## 2020-05-20 MED FILL — LOSARTAN POTASSIUM 100 MG T: 100 | 30 days supply | Qty: 30 | Fill #5

## 2020-06-09 DIAGNOSIS — K219 Gastro-esophageal reflux disease without esophagitis: Secondary | ICD-10-CM | POA: Diagnosis not present

## 2020-06-09 DIAGNOSIS — E7800 Pure hypercholesterolemia, unspecified: Secondary | ICD-10-CM | POA: Diagnosis not present

## 2020-06-09 DIAGNOSIS — I10 Essential (primary) hypertension: Secondary | ICD-10-CM | POA: Diagnosis not present

## 2020-06-09 DIAGNOSIS — Z0001 Encounter for general adult medical examination with abnormal findings: Secondary | ICD-10-CM | POA: Diagnosis not present

## 2020-06-09 DIAGNOSIS — Z125 Encounter for screening for malignant neoplasm of prostate: Secondary | ICD-10-CM | POA: Diagnosis not present

## 2020-06-09 DIAGNOSIS — I2510 Atherosclerotic heart disease of native coronary artery without angina pectoris: Secondary | ICD-10-CM | POA: Diagnosis not present

## 2020-06-09 DIAGNOSIS — F321 Major depressive disorder, single episode, moderate: Secondary | ICD-10-CM | POA: Diagnosis not present

## 2020-06-09 DIAGNOSIS — M170 Bilateral primary osteoarthritis of knee: Secondary | ICD-10-CM | POA: Diagnosis not present

## 2020-06-09 DIAGNOSIS — Z79899 Other long term (current) drug therapy: Secondary | ICD-10-CM | POA: Diagnosis not present

## 2020-06-09 DIAGNOSIS — Z8619 Personal history of other infectious and parasitic diseases: Secondary | ICD-10-CM | POA: Diagnosis not present

## 2020-06-09 DIAGNOSIS — E1165 Type 2 diabetes mellitus with hyperglycemia: Secondary | ICD-10-CM | POA: Diagnosis not present

## 2020-06-09 DIAGNOSIS — F909 Attention-deficit hyperactivity disorder, unspecified type: Secondary | ICD-10-CM | POA: Diagnosis not present

## 2020-06-16 MED FILL — LANTUS SOLOSTAR 100 UNITS/M: 100 | 35 days supply | Qty: 21 | Fill #3

## 2020-06-16 MED FILL — METOPROLOL SUCCINATE ER 25: 25 | 30 days supply | Qty: 30 | Fill #6

## 2020-06-16 MED FILL — OMEPRAZOLE 20 MG CAP: 20 | 90 days supply | Qty: 180 | Fill #1

## 2020-06-16 MED FILL — AMLODIPINE BESYLATE 5 MG TA: 5 | 30 days supply | Qty: 30 | Fill #6

## 2020-06-23 MED FILL — LOSARTAN POTASSIUM 100 MG T: 100 | 30 days supply | Qty: 30 | Fill #6

## 2020-06-23 MED FILL — HYDROCHLOROTHIAZIDE 25 MG T: 25 | 30 days supply | Qty: 30 | Fill #6

## 2020-06-24 MED FILL — METHYLPHENIDATE HCL 20 MG T: 20 | 30 days supply | Qty: 60 | Fill #0

## 2020-07-18 MED FILL — LOSARTAN POTASSIUM 100 MG T: 100 | 30 days supply | Qty: 30 | Fill #7

## 2020-07-18 MED FILL — METOPROLOL SUCCINATE ER 25: 25 | 30 days supply | Qty: 30 | Fill #7

## 2020-07-18 MED FILL — AMLODIPINE BESYLATE 5 MG TA: 5 | 30 days supply | Qty: 30 | Fill #7

## 2020-07-18 MED FILL — HYDROCHLOROTHIAZIDE 25 MG T: 25 | 30 days supply | Qty: 30 | Fill #7

## 2020-08-08 ENCOUNTER — Other Ambulatory Visit (HOSPITAL_COMMUNITY): Payer: Self-pay | Admitting: Internal Medicine

## 2020-08-08 MED FILL — LANTUS SOLOSTAR 100 UNITS/M: 100 | 35 days supply | Qty: 21 | Fill #4

## 2020-08-08 MED FILL — INSULIN ASPART 100 UNIT/ML: 100 | 16 days supply | Qty: 10 | Fill #0

## 2020-08-18 MED FILL — HYDROCHLOROTHIAZIDE 25 MG T: 25 | 30 days supply | Qty: 30 | Fill #8

## 2020-08-18 MED FILL — LOSARTAN POTASSIUM 100 MG T: 100 | 30 days supply | Qty: 30 | Fill #8

## 2020-08-18 MED FILL — AMLODIPINE BESYLATE 5 MG TA: 5 | 30 days supply | Qty: 30 | Fill #8

## 2020-08-18 MED FILL — METOPROLOL SUCCINATE ER 25: 25 | 30 days supply | Qty: 30 | Fill #8

## 2020-08-28 MED FILL — UNIFINE PENTIPS 8MM 31G: 31G X 8 MM | 30 days supply | Qty: 100 | Fill #2

## 2020-09-18 MED FILL — HYDROCHLOROTHIAZIDE 25 MG T: 25 | 30 days supply | Qty: 30 | Fill #9

## 2020-09-18 MED FILL — AMLODIPINE BESYLATE 5 MG TA: 5 | 30 days supply | Qty: 30 | Fill #9

## 2020-09-18 MED FILL — LOSARTAN POTASSIUM 100 MG T: 100 | 30 days supply | Qty: 30 | Fill #9

## 2020-09-18 MED FILL — METOPROLOL SUCCINATE ER 25: 25 | 30 days supply | Qty: 30 | Fill #9

## 2020-09-18 MED FILL — METHYLPHENIDATE HCL 20 MG T: 20 | 30 days supply | Qty: 60 | Fill #0

## 2020-10-06 MED FILL — LANTUS SOLOSTAR 100 UNITS/M: 100 | 35 days supply | Qty: 21 | Fill #5

## 2020-10-20 MED FILL — HYDROCHLOROTHIAZIDE 25 MG T: 25 | 30 days supply | Qty: 30 | Fill #10

## 2020-10-20 MED FILL — LOSARTAN POTASSIUM 100 MG T: 100 | 30 days supply | Qty: 30 | Fill #10

## 2020-10-20 MED FILL — AMLODIPINE BESYLATE 5 MG TA: 5 | 30 days supply | Qty: 30 | Fill #10

## 2020-10-20 MED FILL — METOPROLOL SUCCINATE ER 25: 25 | 30 days supply | Qty: 30 | Fill #10

## 2020-10-28 ENCOUNTER — Other Ambulatory Visit: Payer: Self-pay

## 2020-10-28 NOTE — Patient Outreach (Signed)
  [facility]) Care Management Chronic Special Needs Program    10/28/2020  Name: Mayo, Faulk: Jan 22, 1956  MRN: [id number]   [place] Management will continue to provide services for this member through 12/19/20.  The [employer] care management team will assume care 12/20/2020.   Quinn Plowman RN,BSN,CCM [facility] [phone]

## 2020-10-30 ENCOUNTER — Other Ambulatory Visit (HOSPITAL_COMMUNITY): Payer: Self-pay | Admitting: Internal Medicine

## 2020-10-30 ENCOUNTER — Ambulatory Visit: Payer: Self-pay

## 2020-10-30 MED FILL — METHYLPHENIDATE HCL 20 MG T: 20 | 30 days supply | Qty: 60 | Fill #0

## 2020-11-06 ENCOUNTER — Other Ambulatory Visit: Payer: Self-pay

## 2020-11-06 NOTE — Patient Outreach (Signed)
  [facility]) Care Management Chronic Special Needs Program    11/06/2020  Name: Eddie Mullins, DOB: 08/06/1956  MRN: [id number]   Mr. [name] is enrolled in a chronic special needs plan for Diabetes.Return call received from client. HIPAA verified. Client states he is unable to complete call with RNCM because he is leaving for work. Client confirmed RNCM contact phone and stated he would call on Monday 11/10/20.  PLAN; RNCM will await return call from client. If no return call RNCM will attempt 2nd telephone outreach to client.   Quinn Plowman RN,BSN,CCM [facility] [facility] [phone]

## 2020-11-06 NOTE — Patient Outreach (Signed)
  [place] [facility]) Care Management Chronic Special Needs Program    11/06/2020  Name: Eddie Mullins, DOB: Jul 27, 1956  MRN: [id number]   Mr. Odis Turck is enrolled in a chronic special needs plan for Diabetes. Telephone call to client for CSNP assessment follow up and Health risk assessment completion. Unable to reach. HIPAA compliant voice message left with call back phone number.   PLAN; RNCM will attempt 2nd telephone call to client in 2 weeks.   Quinn Plowman RN,BSN,CCM [facility] [phone]

## 2020-11-12 ENCOUNTER — Other Ambulatory Visit: Payer: Self-pay

## 2020-11-12 NOTE — Patient Outreach (Signed)
  [facility]) Care Management Chronic Special Needs Program    11/12/2020  Name: Eddie Mullins, DOB: 18-Sep-1956  MRN: [id number]   [name] is enrolled in a chronic special needs plan for Diabetes. Telephone call to client for CSNP assessment follow up and update of Health risk assessment. Unable to reach client. HIPAA compliant voice message left with call back phone number and return call request.   PLAN; RNCM will attempt 3rd telephone outreach in 1 week.   Eddie Plowman RN,BSN,CCM [facility] [place] [facility] [phone]

## 2020-11-17 ENCOUNTER — Other Ambulatory Visit: Payer: Self-pay

## 2020-11-17 NOTE — Patient Outreach (Signed)
[facility]) Care Management Chronic Special Needs Program  11/17/2020  Name: Eddie Mullins DOB: [birth year]  MRN: [id number]  [name] is enrolled in a chronic special needs plan for Diabetes. Telephone call to client for CSNP assessment follow up. Unable to reach. Individualized care plan completed based on available data.    Goals Addressed              This Visit's Progress   .  COMPLETED: Client understands the importance of follow-up with providers by attending scheduled visits   On track     Primary care provider visit 06/09/20  Continue to follow up with provider as recommended.     .  Client will report no worsening of symptoms related to heart disease within the next 9 months   On track     Take your medications as prescribed.  Follow up with your provider as recommended.  RN case manager will send client education article: Heart healthy and low salt diet.  Call 911 for severe symptoms:  Chest pain, sweating, nausea/ vomiting, shortness of breath, dizziness or fainting.  Call doctor for symptoms: irregular heart beat, palpitations, rapid heart beat (always call 911 for severe symptoms.      .  Client will report speaking to his HTA concierge about acquiring an new glucometer within 9 months   On track     Contact your RN case manager if you have been unable to obtain glucometer.     .  Client will verbalize knowledge of self management of Hypertension as evidences by BP reading of 140/90 or less; or as defined by provider   On track     Plan to check blood pressure regularly.  If yo do not have a blood pressure monitor one can be provided to you.   Review your [employer] calendar sent to you in the mail for High blood pressure information Plan to eat low salt and heart healthy meals full of fruits, vegetables, whole grains, lean protein and limit fat and sugars.  Increase activity as tolerated,  Take your medication as  prescribed.  Follow up with your doctor as recommended. Call you doctor if you have concerns regarding your blood pressure.      .  Maintain timely refills of diabetic medication as prescribed within the year .   On track     Contact your RN case manager if you have difficulty obtaining your medications Continue to take your medications as prescribed.      .  COMPLETED: Obtain annual  Lipid Profile, LDL-C         Annual Lipid profile 06/09/20    .  COMPLETED: Obtain Annual Eye (retinal)  Exam         Annual eye exam 02/25/20    .  Obtain Annual Foot Exam   Not on track      Diabetes can affect the nerves in your feet, causing decreased feeling or numbness. It is important that your doctor check your feet regularly. Discuss this with your doctor at your next visit.    Diabetes foot care - Check feet daily at home (look for skin color changes, cuts, sores or cracks in the skin, swelling of feet or ankles, ingrown or fungal toenails, corn or calluses). Report these findings to your doctor - Wash feet with soap and water, dry feet well especially between toes - Moisturize your feet but not between the toes - Always wear shoes  that protect your whole feet.      .  COMPLETED: Obtain annual screen for micro albuminuria (urine) , nephropathy (kidney problems)   On track     Annual micro albuminuria 06/09/20     .  Obtain Hemoglobin A1C at least 2 times per year   On track     Hgb A1c completed 06/09/20     .  Patient will verbalize A1c in goal range of 7.0 within 9 months (pt-stated)   On track      Your last documented Hgb A1c is 7.4.  Have your A1c checked every 6 months if you are at goal or every 3 months if you are not at goal (according to your doctors recommendation)  Plan to eat low carbohydrate and low salt meals, watch portion sizes and avoid sugar sweetened drinks.  Review [employer] calendar sent in the mail for Diabetes Action Plan      .  Visit Primary Care  Provider or Endocrinologist at least 2 times per year    On track     Primary care provider visit 06/09/20  Continue to follow up with provider as recommended.          Plan:  Send successful outreach letter with a copy of their individualized care plan, Send individual care plan to provider and Send educational material  Chronic care management coordinator will outreach in: 12 months   Quinn Plowman RN,BSN,CCM [facility] [phone]    .

## 2020-11-18 ENCOUNTER — Ambulatory Visit: Payer: Self-pay

## 2020-11-23 MED FILL — LANTUS SOLOSTAR 100 UNITS/M: 100 | 35 days supply | Qty: 21 | Fill #6

## 2020-11-23 MED FILL — AMLODIPINE BESYLATE 5 MG TA: 5 | 30 days supply | Qty: 30 | Fill #11

## 2020-11-23 MED FILL — HYDROCHLOROTHIAZIDE 25 MG T: 25 | 30 days supply | Qty: 30 | Fill #11

## 2020-11-23 MED FILL — LOSARTAN POTASSIUM 100 MG T: 100 | 30 days supply | Qty: 30 | Fill #11

## 2020-11-24 ENCOUNTER — Other Ambulatory Visit: Payer: Self-pay

## 2020-11-24 MED FILL — METOPROLOL SUCCINATE ER 25: 25 | 30 days supply | Qty: 30 | Fill #11

## 2020-11-24 NOTE — Patient Outreach (Signed)
  [facility]) Care Management Chronic Special Needs Program    11/24/2020  Name: NAIF ALABI, DOB: August 30, 1956  MRN: [id number]   Mr. Ramzey Petrovic is enrolled in a chronic special needs plan for Diabetes.  [facility] Management will continue to provide services for this member through 12/19/20.  The [employer] care management team will assume care 12/20/2020.   Quinn Plowman RN,BSN,CCM [facility] [facility] [phone]

## 2020-12-03 MED FILL — UNIFINE PENTIPS 8MM 31G: 31G X 8 MM | 30 days supply | Qty: 100 | Fill #3

## 2020-12-04 ENCOUNTER — Other Ambulatory Visit (HOSPITAL_COMMUNITY): Payer: Self-pay | Admitting: Internal Medicine

## 2020-12-04 DIAGNOSIS — E1165 Type 2 diabetes mellitus with hyperglycemia: Secondary | ICD-10-CM | POA: Diagnosis not present

## 2020-12-04 DIAGNOSIS — Z8619 Personal history of other infectious and parasitic diseases: Secondary | ICD-10-CM | POA: Diagnosis not present

## 2020-12-04 DIAGNOSIS — F321 Major depressive disorder, single episode, moderate: Secondary | ICD-10-CM | POA: Diagnosis not present

## 2020-12-04 DIAGNOSIS — K219 Gastro-esophageal reflux disease without esophagitis: Secondary | ICD-10-CM | POA: Diagnosis not present

## 2020-12-04 DIAGNOSIS — F909 Attention-deficit hyperactivity disorder, unspecified type: Secondary | ICD-10-CM | POA: Diagnosis not present

## 2020-12-04 DIAGNOSIS — I10 Essential (primary) hypertension: Secondary | ICD-10-CM | POA: Diagnosis not present

## 2020-12-04 DIAGNOSIS — M170 Bilateral primary osteoarthritis of knee: Secondary | ICD-10-CM | POA: Diagnosis not present

## 2020-12-04 DIAGNOSIS — I2510 Atherosclerotic heart disease of native coronary artery without angina pectoris: Secondary | ICD-10-CM | POA: Diagnosis not present

## 2020-12-04 DIAGNOSIS — R634 Abnormal weight loss: Secondary | ICD-10-CM | POA: Diagnosis not present

## 2020-12-04 DIAGNOSIS — E7800 Pure hypercholesterolemia, unspecified: Secondary | ICD-10-CM | POA: Diagnosis not present

## 2020-12-04 MED FILL — METHYLPHENIDATE HCL 20 MG T: 20 | 30 days supply | Qty: 60 | Fill #0

## 2020-12-24 ENCOUNTER — Other Ambulatory Visit: Payer: Self-pay

## 2020-12-26 ENCOUNTER — Other Ambulatory Visit (HOSPITAL_COMMUNITY): Payer: Self-pay | Admitting: Internal Medicine

## 2020-12-26 MED FILL — HYDROCHLOROTHIAZIDE 25 MG T: 25 | 30 days supply | Qty: 30 | Fill #0

## 2020-12-26 MED FILL — AMLODIPINE BESYLATE 5 MG TA: 5 | 30 days supply | Qty: 30 | Fill #0

## 2020-12-26 MED FILL — OMEPRAZOLE 20 MG CAP: 20 | 90 days supply | Qty: 180 | Fill #0

## 2020-12-26 MED FILL — METOPROLOL SUCCINATE ER 25: 25 | 30 days supply | Qty: 30 | Fill #0

## 2020-12-26 MED FILL — LOSARTAN POTASSIUM 100 MG T: 100 | 30 days supply | Qty: 30 | Fill #0

## 2021-01-01 ENCOUNTER — Other Ambulatory Visit: Payer: HMO

## 2021-01-01 DIAGNOSIS — Z20822 Contact with and (suspected) exposure to covid-19: Secondary | ICD-10-CM | POA: Diagnosis not present

## 2021-01-03 LAB — SARS-COV-2, NAA 2 DAY TAT

## 2021-01-03 LAB — NOVEL CORONAVIRUS, NAA: SARS-CoV-2, NAA: NOT DETECTED

## 2021-01-26 ENCOUNTER — Other Ambulatory Visit (HOSPITAL_COMMUNITY): Payer: Self-pay | Admitting: Internal Medicine

## 2021-01-26 MED FILL — METOPROLOL SUCCINATE ER 25: 25 | 30 days supply | Qty: 30 | Fill #1

## 2021-01-26 MED FILL — HYDROCHLOROTHIAZIDE 25 MG T: 25 | 30 days supply | Qty: 30 | Fill #1

## 2021-01-26 MED FILL — LANTUS SOLOSTAR 100 UNITS/M: 100 | 35 days supply | Qty: 21 | Fill #0

## 2021-01-26 MED FILL — AMLODIPINE BESYLATE 5 MG TA: 5 | 30 days supply | Qty: 30 | Fill #1

## 2021-01-26 MED FILL — LOSARTAN POTASSIUM 100 MG T: 100 | 30 days supply | Qty: 30 | Fill #1

## 2021-01-29 MED FILL — METHYLPHENIDATE HCL 20 MG T: 20 | 30 days supply | Qty: 60 | Fill #0

## 2021-02-03 MED FILL — TRUEPLUS SYR 0.5ML 31GX5/16: 31G X 5/16" | 90 days supply | Qty: 100 | Fill #0

## 2021-02-26 MED FILL — METOPROLOL SUCCINATE ER 25: 25 | 30 days supply | Qty: 30 | Fill #2

## 2021-02-26 MED FILL — LOSARTAN POTASSIUM 100 MG T: 100 | 30 days supply | Qty: 30 | Fill #2

## 2021-02-26 MED FILL — INSULIN ASPART 100 UNIT/ML: 100 | 16 days supply | Qty: 10 | Fill #1

## 2021-02-26 MED FILL — HYDROCHLOROTHIAZIDE 25 MG T: 25 | 30 days supply | Qty: 30 | Fill #2

## 2021-02-26 MED FILL — AMLODIPINE BESYLATE 5 MG TA: 5 | 30 days supply | Qty: 30 | Fill #2

## 2021-02-27 DIAGNOSIS — H524 Presbyopia: Secondary | ICD-10-CM | POA: Diagnosis not present

## 2021-02-27 DIAGNOSIS — H2513 Age-related nuclear cataract, bilateral: Secondary | ICD-10-CM | POA: Diagnosis not present

## 2021-02-27 DIAGNOSIS — H11002 Unspecified pterygium of left eye: Secondary | ICD-10-CM | POA: Diagnosis not present

## 2021-02-27 DIAGNOSIS — E119 Type 2 diabetes mellitus without complications: Secondary | ICD-10-CM | POA: Diagnosis not present

## 2021-03-04 DIAGNOSIS — Z8619 Personal history of other infectious and parasitic diseases: Secondary | ICD-10-CM | POA: Diagnosis not present

## 2021-03-04 DIAGNOSIS — E119 Type 2 diabetes mellitus without complications: Secondary | ICD-10-CM | POA: Diagnosis not present

## 2021-03-04 DIAGNOSIS — E7800 Pure hypercholesterolemia, unspecified: Secondary | ICD-10-CM | POA: Diagnosis not present

## 2021-03-04 DIAGNOSIS — R634 Abnormal weight loss: Secondary | ICD-10-CM | POA: Diagnosis not present

## 2021-03-04 DIAGNOSIS — I2510 Atherosclerotic heart disease of native coronary artery without angina pectoris: Secondary | ICD-10-CM | POA: Diagnosis not present

## 2021-03-04 DIAGNOSIS — F321 Major depressive disorder, single episode, moderate: Secondary | ICD-10-CM | POA: Diagnosis not present

## 2021-03-04 DIAGNOSIS — M179 Osteoarthritis of knee, unspecified: Secondary | ICD-10-CM | POA: Diagnosis not present

## 2021-03-04 DIAGNOSIS — Z135 Encounter for screening for eye and ear disorders: Secondary | ICD-10-CM | POA: Diagnosis not present

## 2021-03-04 DIAGNOSIS — E1165 Type 2 diabetes mellitus with hyperglycemia: Secondary | ICD-10-CM | POA: Diagnosis not present

## 2021-03-04 DIAGNOSIS — I10 Essential (primary) hypertension: Secondary | ICD-10-CM | POA: Diagnosis not present

## 2021-03-04 DIAGNOSIS — K219 Gastro-esophageal reflux disease without esophagitis: Secondary | ICD-10-CM | POA: Diagnosis not present

## 2021-03-04 DIAGNOSIS — F909 Attention-deficit hyperactivity disorder, unspecified type: Secondary | ICD-10-CM | POA: Diagnosis not present

## 2021-03-13 DIAGNOSIS — M1711 Unilateral primary osteoarthritis, right knee: Secondary | ICD-10-CM | POA: Diagnosis not present

## 2021-03-19 ENCOUNTER — Other Ambulatory Visit (HOSPITAL_COMMUNITY): Payer: Self-pay | Admitting: Internal Medicine

## 2021-03-21 ENCOUNTER — Other Ambulatory Visit (HOSPITAL_COMMUNITY): Payer: Self-pay

## 2021-03-21 MED FILL — Insulin Pen Needle 31 G X 8 MM (1/3" or 5/16"): 90 days supply | Qty: 100 | Fill #0 | Status: AC

## 2021-03-22 ENCOUNTER — Other Ambulatory Visit (HOSPITAL_COMMUNITY): Payer: Self-pay

## 2021-03-23 ENCOUNTER — Other Ambulatory Visit (HOSPITAL_COMMUNITY): Payer: Self-pay

## 2021-03-29 MED FILL — Losartan Potassium Tab 100 MG: ORAL | 30 days supply | Qty: 30 | Fill #0 | Status: AC

## 2021-03-29 MED FILL — Hydrochlorothiazide Tab 25 MG: ORAL | 30 days supply | Qty: 30 | Fill #0 | Status: AC

## 2021-03-29 MED FILL — Metoprolol Succinate Tab ER 24HR 25 MG (Tartrate Equiv): ORAL | 30 days supply | Qty: 30 | Fill #0 | Status: AC

## 2021-03-29 MED FILL — Amlodipine Besylate Tab 5 MG (Base Equivalent): ORAL | 30 days supply | Qty: 30 | Fill #0 | Status: AC

## 2021-03-30 ENCOUNTER — Other Ambulatory Visit (HOSPITAL_COMMUNITY): Payer: Self-pay

## 2021-04-17 ENCOUNTER — Other Ambulatory Visit (HOSPITAL_COMMUNITY): Payer: Self-pay

## 2021-04-17 MED FILL — Insulin Glargine Soln Pen-Injector 100 Unit/ML: SUBCUTANEOUS | 35 days supply | Qty: 21 | Fill #0 | Status: AC

## 2021-04-18 ENCOUNTER — Other Ambulatory Visit (HOSPITAL_COMMUNITY): Payer: Self-pay

## 2021-04-19 MED FILL — Insulin Syringe/Needle U-100 1/2 ML 31 x 5/16": 90 days supply | Qty: 100 | Fill #0 | Status: AC

## 2021-04-20 ENCOUNTER — Other Ambulatory Visit (HOSPITAL_COMMUNITY): Payer: Self-pay

## 2021-04-21 ENCOUNTER — Other Ambulatory Visit (HOSPITAL_COMMUNITY): Payer: Self-pay

## 2021-04-21 MED ORDER — METHYLPHENIDATE HCL 20 MG PO TABS
ORAL_TABLET | ORAL | 0 refills | Status: DC
  Filled 2021-04-21: qty 60, 30d supply, fill #0

## 2021-04-22 ENCOUNTER — Other Ambulatory Visit (HOSPITAL_COMMUNITY): Payer: Self-pay

## 2021-04-23 ENCOUNTER — Other Ambulatory Visit (HOSPITAL_COMMUNITY): Payer: Self-pay

## 2021-04-28 ENCOUNTER — Other Ambulatory Visit (HOSPITAL_COMMUNITY): Payer: Self-pay

## 2021-04-28 MED FILL — Hydrochlorothiazide Tab 25 MG: ORAL | 30 days supply | Qty: 30 | Fill #1 | Status: AC

## 2021-04-28 MED FILL — Amlodipine Besylate Tab 5 MG (Base Equivalent): ORAL | 30 days supply | Qty: 30 | Fill #1 | Status: AC

## 2021-04-28 MED FILL — Metoprolol Succinate Tab ER 24HR 25 MG (Tartrate Equiv): ORAL | 30 days supply | Qty: 30 | Fill #1 | Status: AC

## 2021-04-28 MED FILL — Losartan Potassium Tab 100 MG: ORAL | 30 days supply | Qty: 30 | Fill #1 | Status: AC

## 2021-04-30 DIAGNOSIS — M7542 Impingement syndrome of left shoulder: Secondary | ICD-10-CM | POA: Diagnosis not present

## 2021-04-30 DIAGNOSIS — M25561 Pain in right knee: Secondary | ICD-10-CM | POA: Diagnosis not present

## 2021-04-30 DIAGNOSIS — M1711 Unilateral primary osteoarthritis, right knee: Secondary | ICD-10-CM | POA: Diagnosis not present

## 2021-05-25 ENCOUNTER — Other Ambulatory Visit (HOSPITAL_COMMUNITY): Payer: Self-pay

## 2021-05-25 DIAGNOSIS — M722 Plantar fascial fibromatosis: Secondary | ICD-10-CM | POA: Diagnosis not present

## 2021-05-25 DIAGNOSIS — M7731 Calcaneal spur, right foot: Secondary | ICD-10-CM | POA: Diagnosis not present

## 2021-05-25 MED ORDER — MELOXICAM 7.5 MG PO TABS
ORAL_TABLET | ORAL | 3 refills | Status: AC
  Filled 2021-05-25: qty 30, 30d supply, fill #0

## 2021-05-27 ENCOUNTER — Other Ambulatory Visit (HOSPITAL_COMMUNITY): Payer: Self-pay

## 2021-05-27 MED ORDER — METHYLPHENIDATE HCL 20 MG PO TABS
ORAL_TABLET | ORAL | 0 refills | Status: DC
Start: 2021-05-27 — End: 2021-06-29
  Filled 2021-05-27: qty 60, 30d supply, fill #0

## 2021-05-27 MED FILL — Metoprolol Succinate Tab ER 24HR 25 MG (Tartrate Equiv): ORAL | 30 days supply | Qty: 30 | Fill #2 | Status: AC

## 2021-05-27 MED FILL — Losartan Potassium Tab 100 MG: ORAL | 30 days supply | Qty: 30 | Fill #2 | Status: AC

## 2021-05-27 MED FILL — Amlodipine Besylate Tab 5 MG (Base Equivalent): ORAL | 30 days supply | Qty: 30 | Fill #2 | Status: AC

## 2021-05-27 MED FILL — Hydrochlorothiazide Tab 25 MG: ORAL | 30 days supply | Qty: 30 | Fill #2 | Status: AC

## 2021-05-28 ENCOUNTER — Other Ambulatory Visit (HOSPITAL_COMMUNITY): Payer: Self-pay

## 2021-05-28 MED FILL — Insulin Glargine Soln Pen-Injector 100 Unit/ML: SUBCUTANEOUS | 35 days supply | Qty: 21 | Fill #1 | Status: AC

## 2021-05-29 ENCOUNTER — Other Ambulatory Visit (HOSPITAL_COMMUNITY): Payer: Self-pay

## 2021-06-01 DIAGNOSIS — M722 Plantar fascial fibromatosis: Secondary | ICD-10-CM | POA: Diagnosis not present

## 2021-06-08 DIAGNOSIS — M722 Plantar fascial fibromatosis: Secondary | ICD-10-CM | POA: Diagnosis not present

## 2021-06-09 ENCOUNTER — Other Ambulatory Visit (HOSPITAL_COMMUNITY): Payer: Self-pay

## 2021-06-09 DIAGNOSIS — E7800 Pure hypercholesterolemia, unspecified: Secondary | ICD-10-CM | POA: Diagnosis not present

## 2021-06-09 DIAGNOSIS — F321 Major depressive disorder, single episode, moderate: Secondary | ICD-10-CM | POA: Diagnosis not present

## 2021-06-09 DIAGNOSIS — I2510 Atherosclerotic heart disease of native coronary artery without angina pectoris: Secondary | ICD-10-CM | POA: Diagnosis not present

## 2021-06-09 DIAGNOSIS — Z79899 Other long term (current) drug therapy: Secondary | ICD-10-CM | POA: Diagnosis not present

## 2021-06-09 DIAGNOSIS — E1165 Type 2 diabetes mellitus with hyperglycemia: Secondary | ICD-10-CM | POA: Diagnosis not present

## 2021-06-09 DIAGNOSIS — I10 Essential (primary) hypertension: Secondary | ICD-10-CM | POA: Diagnosis not present

## 2021-06-09 DIAGNOSIS — Z125 Encounter for screening for malignant neoplasm of prostate: Secondary | ICD-10-CM | POA: Diagnosis not present

## 2021-06-09 DIAGNOSIS — Z0001 Encounter for general adult medical examination with abnormal findings: Secondary | ICD-10-CM | POA: Diagnosis not present

## 2021-06-09 DIAGNOSIS — Z8619 Personal history of other infectious and parasitic diseases: Secondary | ICD-10-CM | POA: Diagnosis not present

## 2021-06-09 DIAGNOSIS — Z23 Encounter for immunization: Secondary | ICD-10-CM | POA: Diagnosis not present

## 2021-06-09 DIAGNOSIS — E119 Type 2 diabetes mellitus without complications: Secondary | ICD-10-CM | POA: Diagnosis not present

## 2021-06-09 DIAGNOSIS — K219 Gastro-esophageal reflux disease without esophagitis: Secondary | ICD-10-CM | POA: Diagnosis not present

## 2021-06-09 DIAGNOSIS — F909 Attention-deficit hyperactivity disorder, unspecified type: Secondary | ICD-10-CM | POA: Diagnosis not present

## 2021-06-09 DIAGNOSIS — Z7984 Long term (current) use of oral hypoglycemic drugs: Secondary | ICD-10-CM | POA: Diagnosis not present

## 2021-06-09 MED FILL — Insulin Pen Needle 31 G X 8 MM (1/3" or 5/16"): 90 days supply | Qty: 100 | Fill #1 | Status: AC

## 2021-06-09 MED FILL — Insulin Syringe/Needle U-100 1/2 ML 31 x 5/16": 90 days supply | Qty: 100 | Fill #1 | Status: CN

## 2021-06-11 ENCOUNTER — Other Ambulatory Visit (HOSPITAL_COMMUNITY): Payer: Self-pay

## 2021-06-15 ENCOUNTER — Other Ambulatory Visit (HOSPITAL_COMMUNITY): Payer: Self-pay

## 2021-06-15 DIAGNOSIS — M71571 Other bursitis, not elsewhere classified, right ankle and foot: Secondary | ICD-10-CM | POA: Diagnosis not present

## 2021-06-15 DIAGNOSIS — M722 Plantar fascial fibromatosis: Secondary | ICD-10-CM | POA: Diagnosis not present

## 2021-06-15 MED ORDER — FREESTYLE LIBRE 2 SENSOR MISC
11 refills | Status: AC
  Filled 2021-06-15: qty 2, 28d supply, fill #0
  Filled 2021-07-08: qty 2, 28d supply, fill #1
  Filled 2021-08-05: qty 2, 28d supply, fill #2

## 2021-06-15 MED ORDER — FREESTYLE LIBRE 2 READER DEVI
0 refills | Status: AC
  Filled 2021-06-15: qty 1, 30d supply, fill #0

## 2021-06-15 MED ORDER — METHYLPREDNISOLONE 4 MG PO TBPK
ORAL_TABLET | ORAL | 0 refills | Status: AC
  Filled 2021-06-15: qty 21, 6d supply, fill #0

## 2021-06-15 MED FILL — Insulin Syringe/Needle U-100 1/2 ML 31 x 5/16": 90 days supply | Qty: 100 | Fill #1 | Status: CN

## 2021-06-16 ENCOUNTER — Other Ambulatory Visit (HOSPITAL_COMMUNITY): Payer: Self-pay

## 2021-06-16 MED ORDER — "INSULIN SYRINGE-NEEDLE U-100 31G X 5/16"" 0.5 ML MISC"
5 refills | Status: AC
  Filled 2021-06-16: qty 100, 25d supply, fill #0
  Filled 2021-11-19: qty 100, 25d supply, fill #1
  Filled 2022-03-11: qty 100, 25d supply, fill #2

## 2021-06-17 ENCOUNTER — Other Ambulatory Visit (HOSPITAL_COMMUNITY): Payer: Self-pay

## 2021-06-23 DIAGNOSIS — M71571 Other bursitis, not elsewhere classified, right ankle and foot: Secondary | ICD-10-CM | POA: Diagnosis not present

## 2021-06-23 DIAGNOSIS — M7731 Calcaneal spur, right foot: Secondary | ICD-10-CM | POA: Diagnosis not present

## 2021-06-23 DIAGNOSIS — M722 Plantar fascial fibromatosis: Secondary | ICD-10-CM | POA: Diagnosis not present

## 2021-06-29 ENCOUNTER — Other Ambulatory Visit (HOSPITAL_COMMUNITY): Payer: Self-pay

## 2021-06-29 MED ORDER — METHYLPHENIDATE HCL 20 MG PO TABS
ORAL_TABLET | ORAL | 0 refills | Status: DC
  Filled 2021-06-29: qty 60, 30d supply, fill #0

## 2021-06-29 MED FILL — Omeprazole Cap Delayed Release 20 MG: ORAL | 90 days supply | Qty: 180 | Fill #0 | Status: AC

## 2021-06-29 MED FILL — Metoprolol Succinate Tab ER 24HR 25 MG (Tartrate Equiv): ORAL | 30 days supply | Qty: 30 | Fill #3 | Status: AC

## 2021-06-29 MED FILL — Losartan Potassium Tab 100 MG: ORAL | 30 days supply | Qty: 30 | Fill #3 | Status: AC

## 2021-06-29 MED FILL — Hydrochlorothiazide Tab 25 MG: ORAL | 30 days supply | Qty: 30 | Fill #3 | Status: AC

## 2021-06-29 MED FILL — Amlodipine Besylate Tab 5 MG (Base Equivalent): ORAL | 30 days supply | Qty: 30 | Fill #3 | Status: AC

## 2021-06-30 ENCOUNTER — Other Ambulatory Visit (HOSPITAL_COMMUNITY): Payer: Self-pay

## 2021-07-07 DIAGNOSIS — S92014A Nondisplaced fracture of body of right calcaneus, initial encounter for closed fracture: Secondary | ICD-10-CM | POA: Diagnosis not present

## 2021-07-08 ENCOUNTER — Other Ambulatory Visit (HOSPITAL_COMMUNITY): Payer: Self-pay

## 2021-07-09 ENCOUNTER — Other Ambulatory Visit (HOSPITAL_COMMUNITY): Payer: Self-pay

## 2021-07-14 ENCOUNTER — Other Ambulatory Visit (HOSPITAL_COMMUNITY): Payer: Self-pay

## 2021-07-14 MED FILL — Insulin Glargine Soln Pen-Injector 100 Unit/ML: SUBCUTANEOUS | 35 days supply | Qty: 21 | Fill #2 | Status: AC

## 2021-07-17 DIAGNOSIS — S92014D Nondisplaced fracture of body of right calcaneus, subsequent encounter for fracture with routine healing: Secondary | ICD-10-CM | POA: Diagnosis not present

## 2021-07-29 ENCOUNTER — Other Ambulatory Visit (HOSPITAL_COMMUNITY): Payer: Self-pay

## 2021-07-29 MED FILL — Hydrochlorothiazide Tab 25 MG: ORAL | 30 days supply | Qty: 30 | Fill #4 | Status: AC

## 2021-07-29 MED FILL — Amlodipine Besylate Tab 5 MG (Base Equivalent): ORAL | 30 days supply | Qty: 30 | Fill #4 | Status: AC

## 2021-07-29 MED FILL — Losartan Potassium Tab 100 MG: ORAL | 30 days supply | Qty: 30 | Fill #4 | Status: AC

## 2021-07-29 MED FILL — Metoprolol Succinate Tab ER 24HR 25 MG (Tartrate Equiv): ORAL | 30 days supply | Qty: 30 | Fill #4 | Status: AC

## 2021-07-30 DIAGNOSIS — S92014D Nondisplaced fracture of body of right calcaneus, subsequent encounter for fracture with routine healing: Secondary | ICD-10-CM | POA: Diagnosis not present

## 2021-08-05 ENCOUNTER — Other Ambulatory Visit (HOSPITAL_COMMUNITY): Payer: Self-pay

## 2021-08-05 MED ORDER — METHYLPHENIDATE HCL 20 MG PO TABS
ORAL_TABLET | ORAL | 0 refills | Status: DC
  Filled 2021-08-05: qty 60, 30d supply, fill #0

## 2021-08-06 DIAGNOSIS — S92014D Nondisplaced fracture of body of right calcaneus, subsequent encounter for fracture with routine healing: Secondary | ICD-10-CM | POA: Diagnosis not present

## 2021-08-27 MED FILL — Losartan Potassium Tab 100 MG: ORAL | 30 days supply | Qty: 30 | Fill #5 | Status: AC

## 2021-08-27 MED FILL — Insulin Glargine Soln Pen-Injector 100 Unit/ML: SUBCUTANEOUS | 35 days supply | Qty: 21 | Fill #3 | Status: AC

## 2021-08-27 MED FILL — Amlodipine Besylate Tab 5 MG (Base Equivalent): ORAL | 30 days supply | Qty: 30 | Fill #5 | Status: AC

## 2021-08-27 MED FILL — Metoprolol Succinate Tab ER 24HR 25 MG (Tartrate Equiv): ORAL | 30 days supply | Qty: 30 | Fill #5 | Status: AC

## 2021-08-27 MED FILL — Hydrochlorothiazide Tab 25 MG: ORAL | 30 days supply | Qty: 30 | Fill #5 | Status: AC

## 2021-08-28 ENCOUNTER — Other Ambulatory Visit (HOSPITAL_COMMUNITY): Payer: Self-pay

## 2021-09-08 ENCOUNTER — Other Ambulatory Visit (HOSPITAL_COMMUNITY): Payer: Self-pay

## 2021-09-09 ENCOUNTER — Other Ambulatory Visit (HOSPITAL_COMMUNITY): Payer: Self-pay

## 2021-09-09 MED ORDER — METHYLPHENIDATE HCL 20 MG PO TABS
ORAL_TABLET | ORAL | 0 refills | Status: DC
  Filled 2021-09-09: qty 60, 30d supply, fill #0

## 2021-09-10 DIAGNOSIS — Z1211 Encounter for screening for malignant neoplasm of colon: Secondary | ICD-10-CM | POA: Diagnosis not present

## 2021-09-10 DIAGNOSIS — E7800 Pure hypercholesterolemia, unspecified: Secondary | ICD-10-CM | POA: Diagnosis not present

## 2021-09-10 DIAGNOSIS — I2510 Atherosclerotic heart disease of native coronary artery without angina pectoris: Secondary | ICD-10-CM | POA: Diagnosis not present

## 2021-09-10 DIAGNOSIS — F909 Attention-deficit hyperactivity disorder, unspecified type: Secondary | ICD-10-CM | POA: Diagnosis not present

## 2021-09-10 DIAGNOSIS — I10 Essential (primary) hypertension: Secondary | ICD-10-CM | POA: Diagnosis not present

## 2021-09-10 DIAGNOSIS — E1165 Type 2 diabetes mellitus with hyperglycemia: Secondary | ICD-10-CM | POA: Diagnosis not present

## 2021-09-10 DIAGNOSIS — E119 Type 2 diabetes mellitus without complications: Secondary | ICD-10-CM | POA: Diagnosis not present

## 2021-09-23 ENCOUNTER — Other Ambulatory Visit (HOSPITAL_COMMUNITY): Payer: Self-pay

## 2021-09-23 MED ORDER — AMOXICILLIN 500 MG PO CAPS
500.0000 mg | ORAL_CAPSULE | Freq: Three times a day (TID) | ORAL | 1 refills | Status: AC
  Filled 2021-09-23: qty 21, 7d supply, fill #0
  Filled 2021-10-05: qty 21, 7d supply, fill #1

## 2021-09-23 MED FILL — Metoprolol Succinate Tab ER 24HR 25 MG (Tartrate Equiv): ORAL | 30 days supply | Qty: 30 | Fill #6 | Status: AC

## 2021-09-23 MED FILL — Losartan Potassium Tab 100 MG: ORAL | 30 days supply | Qty: 30 | Fill #6 | Status: AC

## 2021-09-23 MED FILL — Insulin Pen Needle 31 G X 8 MM (1/3" or 5/16"): 90 days supply | Qty: 100 | Fill #2 | Status: AC

## 2021-09-23 MED FILL — Amlodipine Besylate Tab 5 MG (Base Equivalent): ORAL | 30 days supply | Qty: 30 | Fill #6 | Status: AC

## 2021-09-23 MED FILL — Hydrochlorothiazide Tab 25 MG: ORAL | 30 days supply | Qty: 30 | Fill #6 | Status: AC

## 2021-10-05 ENCOUNTER — Other Ambulatory Visit (HOSPITAL_COMMUNITY): Payer: Self-pay

## 2021-10-15 ENCOUNTER — Other Ambulatory Visit (HOSPITAL_COMMUNITY): Payer: Self-pay

## 2021-10-15 MED ORDER — METHYLPHENIDATE HCL 20 MG PO TABS
ORAL_TABLET | ORAL | 0 refills | Status: DC
  Filled 2021-10-15: qty 60, 30d supply, fill #0

## 2021-10-15 MED FILL — Insulin Glargine Soln Pen-Injector 100 Unit/ML: SUBCUTANEOUS | 35 days supply | Qty: 21 | Fill #4 | Status: AC

## 2021-10-16 ENCOUNTER — Other Ambulatory Visit (HOSPITAL_COMMUNITY): Payer: Self-pay

## 2021-10-28 ENCOUNTER — Other Ambulatory Visit (HOSPITAL_COMMUNITY): Payer: Self-pay

## 2021-10-28 MED FILL — Metoprolol Succinate Tab ER 24HR 25 MG (Tartrate Equiv): ORAL | 30 days supply | Qty: 30 | Fill #7 | Status: AC

## 2021-10-28 MED FILL — Losartan Potassium Tab 100 MG: ORAL | 30 days supply | Qty: 30 | Fill #7 | Status: AC

## 2021-10-28 MED FILL — Hydrochlorothiazide Tab 25 MG: ORAL | 30 days supply | Qty: 30 | Fill #7 | Status: AC

## 2021-10-28 MED FILL — Amlodipine Besylate Tab 5 MG (Base Equivalent): ORAL | 30 days supply | Qty: 30 | Fill #7 | Status: AC

## 2021-11-19 ENCOUNTER — Other Ambulatory Visit (HOSPITAL_COMMUNITY): Payer: Self-pay

## 2021-11-19 MED ORDER — METHYLPHENIDATE HCL 20 MG PO TABS
ORAL_TABLET | ORAL | 0 refills | Status: DC
  Filled 2021-11-19: qty 60, 30d supply, fill #0

## 2021-11-19 MED FILL — Insulin Pen Needle 31 G X 8 MM (1/3" or 5/16"): 90 days supply | Qty: 100 | Fill #3 | Status: CN

## 2021-11-20 ENCOUNTER — Other Ambulatory Visit (HOSPITAL_COMMUNITY): Payer: Self-pay

## 2021-11-23 ENCOUNTER — Other Ambulatory Visit (HOSPITAL_COMMUNITY): Payer: Self-pay

## 2021-11-23 MED FILL — Amlodipine Besylate Tab 5 MG (Base Equivalent): ORAL | 30 days supply | Qty: 30 | Fill #8 | Status: AC

## 2021-11-23 MED FILL — Losartan Potassium Tab 100 MG: ORAL | 30 days supply | Qty: 30 | Fill #8 | Status: AC

## 2021-11-23 MED FILL — Metoprolol Succinate Tab ER 24HR 25 MG (Tartrate Equiv): ORAL | 30 days supply | Qty: 30 | Fill #8 | Status: AC

## 2021-11-23 MED FILL — Hydrochlorothiazide Tab 25 MG: ORAL | 30 days supply | Qty: 30 | Fill #8 | Status: AC

## 2021-11-24 ENCOUNTER — Other Ambulatory Visit (HOSPITAL_COMMUNITY): Payer: Self-pay

## 2021-11-24 MED ORDER — INSULIN ASPART 100 UNIT/ML IJ SOLN
INTRAMUSCULAR | 3 refills | Status: AC
  Filled 2021-11-24: qty 20, 44d supply, fill #0

## 2021-11-25 ENCOUNTER — Other Ambulatory Visit (HOSPITAL_COMMUNITY): Payer: Self-pay

## 2021-12-01 DIAGNOSIS — E1165 Type 2 diabetes mellitus with hyperglycemia: Secondary | ICD-10-CM | POA: Diagnosis not present

## 2021-12-01 DIAGNOSIS — Z7984 Long term (current) use of oral hypoglycemic drugs: Secondary | ICD-10-CM | POA: Diagnosis not present

## 2021-12-24 ENCOUNTER — Other Ambulatory Visit (HOSPITAL_COMMUNITY): Payer: Self-pay

## 2021-12-24 MED ORDER — METHYLPHENIDATE HCL 20 MG PO TABS
ORAL_TABLET | ORAL | 0 refills | Status: DC
  Filled 2021-12-24: qty 60, 30d supply, fill #0

## 2021-12-24 MED FILL — Insulin Pen Needle 31 G X 8 MM (1/3" or 5/16"): 90 days supply | Qty: 100 | Fill #3 | Status: AC

## 2021-12-24 MED FILL — Omeprazole Cap Delayed Release 20 MG: ORAL | 90 days supply | Qty: 180 | Fill #1 | Status: AC

## 2021-12-24 MED FILL — Insulin Glargine Soln Pen-Injector 100 Unit/ML: SUBCUTANEOUS | 30 days supply | Qty: 18 | Fill #5 | Status: AC

## 2021-12-25 ENCOUNTER — Other Ambulatory Visit (HOSPITAL_COMMUNITY): Payer: Self-pay

## 2021-12-25 MED ORDER — METOPROLOL SUCCINATE ER 25 MG PO TB24
ORAL_TABLET | ORAL | 0 refills | Status: DC
  Filled 2021-12-25: qty 90, 90d supply, fill #0

## 2021-12-25 MED ORDER — LOSARTAN POTASSIUM 100 MG PO TABS
ORAL_TABLET | ORAL | 0 refills | Status: DC
  Filled 2021-12-25: qty 90, 90d supply, fill #0

## 2021-12-25 MED ORDER — AMLODIPINE BESYLATE 5 MG PO TABS
ORAL_TABLET | ORAL | 0 refills | Status: DC
  Filled 2021-12-25: qty 90, 90d supply, fill #0

## 2021-12-26 ENCOUNTER — Other Ambulatory Visit (HOSPITAL_COMMUNITY): Payer: Self-pay

## 2021-12-30 ENCOUNTER — Other Ambulatory Visit (HOSPITAL_COMMUNITY): Payer: Self-pay

## 2021-12-30 MED ORDER — HYDROCHLOROTHIAZIDE 25 MG PO TABS
ORAL_TABLET | ORAL | 3 refills | Status: AC
  Filled 2021-12-30: qty 90, 90d supply, fill #0
  Filled 2022-04-01: qty 90, 90d supply, fill #1
  Filled 2022-10-08: qty 90, 90d supply, fill #2

## 2021-12-31 ENCOUNTER — Other Ambulatory Visit (HOSPITAL_COMMUNITY): Payer: Self-pay

## 2022-01-04 ENCOUNTER — Other Ambulatory Visit (HOSPITAL_COMMUNITY): Payer: Self-pay

## 2022-01-28 ENCOUNTER — Other Ambulatory Visit (HOSPITAL_COMMUNITY): Payer: Self-pay

## 2022-01-28 MED ORDER — METHYLPHENIDATE HCL 20 MG PO TABS
20.0000 mg | ORAL_TABLET | Freq: Two times a day (BID) | ORAL | 0 refills | Status: DC
  Filled 2022-01-28: qty 60, 30d supply, fill #0

## 2022-02-02 DIAGNOSIS — J3489 Other specified disorders of nose and nasal sinuses: Secondary | ICD-10-CM | POA: Diagnosis not present

## 2022-02-24 ENCOUNTER — Other Ambulatory Visit (HOSPITAL_COMMUNITY): Payer: Self-pay

## 2022-02-24 MED ORDER — LANTUS SOLOSTAR 100 UNIT/ML ~~LOC~~ SOPN
PEN_INJECTOR | SUBCUTANEOUS | 11 refills | Status: AC
  Filled 2022-02-24: qty 18, 30d supply, fill #0
  Filled 2022-04-19: qty 18, 30d supply, fill #1
  Filled 2022-06-19: qty 18, 30d supply, fill #2
  Filled 2022-08-12: qty 18, 30d supply, fill #3
  Filled 2022-09-27: qty 18, 30d supply, fill #4
  Filled 2022-12-12: qty 18, 30d supply, fill #5

## 2022-02-25 ENCOUNTER — Other Ambulatory Visit (HOSPITAL_COMMUNITY): Payer: Self-pay

## 2022-03-11 ENCOUNTER — Other Ambulatory Visit (HOSPITAL_COMMUNITY): Payer: Self-pay

## 2022-03-11 MED ORDER — METHYLPHENIDATE HCL 20 MG PO TABS
20.0000 mg | ORAL_TABLET | Freq: Two times a day (BID) | ORAL | 0 refills | Status: DC
  Filled 2022-03-11: qty 60, 30d supply, fill #0

## 2022-03-12 ENCOUNTER — Other Ambulatory Visit (HOSPITAL_COMMUNITY): Payer: Self-pay

## 2022-04-01 ENCOUNTER — Other Ambulatory Visit (HOSPITAL_COMMUNITY): Payer: Self-pay

## 2022-04-01 MED ORDER — AMLODIPINE BESYLATE 5 MG PO TABS
5.0000 mg | ORAL_TABLET | Freq: Every day | ORAL | 1 refills | Status: AC
  Filled 2022-04-01: qty 90, 90d supply, fill #0
  Filled 2022-09-27: qty 90, 90d supply, fill #1

## 2022-04-01 MED ORDER — METOPROLOL SUCCINATE ER 25 MG PO TB24
25.0000 mg | ORAL_TABLET | Freq: Every day | ORAL | 1 refills | Status: AC
  Filled 2022-04-01: qty 90, 90d supply, fill #0

## 2022-04-01 MED ORDER — LOSARTAN POTASSIUM 100 MG PO TABS
100.0000 mg | ORAL_TABLET | Freq: Every day | ORAL | 1 refills | Status: AC
  Filled 2022-04-01: qty 90, 90d supply, fill #0
  Filled 2022-09-27: qty 90, 90d supply, fill #1

## 2022-04-03 ENCOUNTER — Other Ambulatory Visit (HOSPITAL_COMMUNITY): Payer: Self-pay

## 2022-04-03 MED ORDER — INSULIN PEN NEEDLE 31G X 8 MM MISC
3 refills | Status: AC
  Filled 2022-04-03: qty 100, 90d supply, fill #0
  Filled 2022-06-28: qty 100, 90d supply, fill #1
  Filled 2022-10-04: qty 100, 90d supply, fill #2

## 2022-04-06 ENCOUNTER — Other Ambulatory Visit (HOSPITAL_COMMUNITY): Payer: Self-pay

## 2022-04-06 MED ORDER — METHYLPHENIDATE HCL 20 MG PO TABS
20.0000 mg | ORAL_TABLET | Freq: Two times a day (BID) | ORAL | 0 refills | Status: DC
  Filled 2022-04-06: qty 60, 30d supply, fill #0

## 2022-04-19 ENCOUNTER — Other Ambulatory Visit (HOSPITAL_COMMUNITY): Payer: Self-pay

## 2022-04-19 MED ORDER — METHYLPHENIDATE HCL 20 MG PO TABS
20.0000 mg | ORAL_TABLET | Freq: Two times a day (BID) | ORAL | 0 refills | Status: DC
  Filled 2022-04-19: qty 60, 30d supply, fill #0

## 2022-05-06 ENCOUNTER — Other Ambulatory Visit (HOSPITAL_COMMUNITY): Payer: Self-pay

## 2022-05-06 MED ORDER — TADALAFIL 20 MG PO TABS
ORAL_TABLET | ORAL | 3 refills | Status: AC
  Filled 2022-05-06: qty 15, 30d supply, fill #0
  Filled 2022-06-10: qty 15, 30d supply, fill #1
  Filled 2022-07-05: qty 15, 30d supply, fill #2

## 2022-05-07 ENCOUNTER — Other Ambulatory Visit (HOSPITAL_COMMUNITY): Payer: Self-pay

## 2022-06-07 ENCOUNTER — Other Ambulatory Visit (HOSPITAL_COMMUNITY): Payer: Self-pay

## 2022-06-07 MED ORDER — METHYLPHENIDATE HCL 20 MG PO TABS
20.0000 mg | ORAL_TABLET | Freq: Two times a day (BID) | ORAL | 0 refills | Status: AC
  Filled 2022-06-07: qty 52, 26d supply, fill #0

## 2022-06-10 ENCOUNTER — Other Ambulatory Visit (HOSPITAL_COMMUNITY): Payer: Self-pay

## 2022-06-10 DIAGNOSIS — H1033 Unspecified acute conjunctivitis, bilateral: Secondary | ICD-10-CM | POA: Diagnosis not present

## 2022-06-10 MED ORDER — TOBRAMYCIN 0.3 % OP SOLN
OPHTHALMIC | 0 refills | Status: DC
  Filled 2022-06-10: qty 5, 3d supply, fill #0
  Filled 2022-06-10: qty 5, 4d supply, fill #0

## 2022-06-11 ENCOUNTER — Other Ambulatory Visit (HOSPITAL_COMMUNITY): Payer: Self-pay

## 2022-06-14 ENCOUNTER — Other Ambulatory Visit (HOSPITAL_COMMUNITY): Payer: Self-pay

## 2022-06-14 DIAGNOSIS — I2510 Atherosclerotic heart disease of native coronary artery without angina pectoris: Secondary | ICD-10-CM | POA: Diagnosis not present

## 2022-06-14 DIAGNOSIS — Z1211 Encounter for screening for malignant neoplasm of colon: Secondary | ICD-10-CM | POA: Diagnosis not present

## 2022-06-14 DIAGNOSIS — G720 Drug-induced myopathy: Secondary | ICD-10-CM | POA: Diagnosis not present

## 2022-06-14 DIAGNOSIS — M179 Osteoarthritis of knee, unspecified: Secondary | ICD-10-CM | POA: Diagnosis not present

## 2022-06-14 DIAGNOSIS — Z0000 Encounter for general adult medical examination without abnormal findings: Secondary | ICD-10-CM | POA: Diagnosis not present

## 2022-06-14 DIAGNOSIS — I10 Essential (primary) hypertension: Secondary | ICD-10-CM | POA: Diagnosis not present

## 2022-06-14 DIAGNOSIS — Z125 Encounter for screening for malignant neoplasm of prostate: Secondary | ICD-10-CM | POA: Diagnosis not present

## 2022-06-14 DIAGNOSIS — E1165 Type 2 diabetes mellitus with hyperglycemia: Secondary | ICD-10-CM | POA: Diagnosis not present

## 2022-06-14 DIAGNOSIS — N529 Male erectile dysfunction, unspecified: Secondary | ICD-10-CM | POA: Diagnosis not present

## 2022-06-14 DIAGNOSIS — Z8619 Personal history of other infectious and parasitic diseases: Secondary | ICD-10-CM | POA: Diagnosis not present

## 2022-06-14 DIAGNOSIS — E119 Type 2 diabetes mellitus without complications: Secondary | ICD-10-CM | POA: Diagnosis not present

## 2022-06-14 DIAGNOSIS — E7800 Pure hypercholesterolemia, unspecified: Secondary | ICD-10-CM | POA: Diagnosis not present

## 2022-06-14 DIAGNOSIS — N486 Induration penis plastica: Secondary | ICD-10-CM | POA: Diagnosis not present

## 2022-06-14 DIAGNOSIS — F909 Attention-deficit hyperactivity disorder, unspecified type: Secondary | ICD-10-CM | POA: Diagnosis not present

## 2022-06-14 MED ORDER — INSULIN ASPART 100 UNIT/ML IJ SOLN
INTRAMUSCULAR | 3 refills | Status: AC
  Filled 2022-06-14: qty 20, 90d supply, fill #0

## 2022-06-14 MED ORDER — LOSARTAN POTASSIUM 100 MG PO TABS
ORAL_TABLET | ORAL | 3 refills | Status: AC
  Filled 2022-06-14: qty 90, 90d supply, fill #0
  Filled 2022-12-12: qty 30, 30d supply, fill #1

## 2022-06-14 MED ORDER — METOPROLOL SUCCINATE ER 25 MG PO TB24
ORAL_TABLET | ORAL | 11 refills | Status: AC
  Filled 2022-06-14: qty 30, 30d supply, fill #0

## 2022-06-14 MED ORDER — METHYLPHENIDATE HCL 20 MG PO TABS
ORAL_TABLET | ORAL | 0 refills | Status: DC
  Filled 2022-07-14: qty 60, 30d supply, fill #0

## 2022-06-14 MED ORDER — NITROGLYCERIN 0.4 MG SL SUBL
SUBLINGUAL_TABLET | SUBLINGUAL | 2 refills | Status: AC
  Filled 2022-06-14: qty 25, 5d supply, fill #0

## 2022-06-14 MED ORDER — AMLODIPINE BESYLATE 5 MG PO TABS
ORAL_TABLET | ORAL | 3 refills | Status: AC
  Filled 2022-06-14: qty 90, 90d supply, fill #0
  Filled 2022-12-12: qty 30, 30d supply, fill #1

## 2022-06-14 MED ORDER — HYDROCHLOROTHIAZIDE 25 MG PO TABS
ORAL_TABLET | ORAL | 3 refills | Status: AC
  Filled 2022-06-14: qty 90, 90d supply, fill #0
  Filled 2022-12-12: qty 30, 30d supply, fill #1

## 2022-06-15 ENCOUNTER — Other Ambulatory Visit (HOSPITAL_COMMUNITY): Payer: Self-pay

## 2022-06-19 ENCOUNTER — Other Ambulatory Visit (HOSPITAL_COMMUNITY): Payer: Self-pay

## 2022-06-28 ENCOUNTER — Other Ambulatory Visit (HOSPITAL_COMMUNITY): Payer: Self-pay

## 2022-06-29 ENCOUNTER — Other Ambulatory Visit (HOSPITAL_COMMUNITY): Payer: Self-pay

## 2022-07-02 ENCOUNTER — Other Ambulatory Visit (HOSPITAL_COMMUNITY): Payer: Self-pay

## 2022-07-05 ENCOUNTER — Other Ambulatory Visit (HOSPITAL_COMMUNITY): Payer: Self-pay

## 2022-07-05 MED ORDER — TADALAFIL 20 MG PO TABS
ORAL_TABLET | ORAL | 3 refills | Status: AC
  Filled 2022-07-05: qty 15, 30d supply, fill #0
  Filled 2022-09-06 – 2022-11-12 (×3): qty 15, 30d supply, fill #1
  Filled 2022-12-12 – 2022-12-16 (×3): qty 15, 30d supply, fill #2
  Filled 2023-01-20 – 2023-01-25 (×2): qty 15, 30d supply, fill #3

## 2022-07-05 MED ORDER — OMEPRAZOLE 20 MG PO CPDR
DELAYED_RELEASE_CAPSULE | ORAL | 3 refills | Status: AC
  Filled 2022-07-05: qty 180, 90d supply, fill #0

## 2022-07-06 ENCOUNTER — Other Ambulatory Visit (HOSPITAL_COMMUNITY): Payer: Self-pay

## 2022-07-07 ENCOUNTER — Other Ambulatory Visit (HOSPITAL_COMMUNITY): Payer: Self-pay

## 2022-07-14 ENCOUNTER — Other Ambulatory Visit (HOSPITAL_COMMUNITY): Payer: Self-pay

## 2022-07-15 ENCOUNTER — Other Ambulatory Visit (HOSPITAL_COMMUNITY): Payer: Self-pay

## 2022-08-06 ENCOUNTER — Other Ambulatory Visit (HOSPITAL_COMMUNITY): Payer: Self-pay

## 2022-08-12 ENCOUNTER — Other Ambulatory Visit (HOSPITAL_COMMUNITY): Payer: Self-pay

## 2022-08-26 ENCOUNTER — Other Ambulatory Visit (HOSPITAL_COMMUNITY): Payer: Self-pay

## 2022-08-26 MED ORDER — METHYLPHENIDATE HCL 20 MG PO TABS
ORAL_TABLET | ORAL | 0 refills | Status: AC
  Filled 2022-08-26: qty 60, 30d supply, fill #0

## 2022-09-07 ENCOUNTER — Other Ambulatory Visit (HOSPITAL_COMMUNITY): Payer: Self-pay

## 2022-09-08 ENCOUNTER — Other Ambulatory Visit (HOSPITAL_COMMUNITY): Payer: Self-pay

## 2022-09-09 ENCOUNTER — Other Ambulatory Visit (HOSPITAL_COMMUNITY): Payer: Self-pay

## 2022-09-13 ENCOUNTER — Other Ambulatory Visit (HOSPITAL_COMMUNITY): Payer: Self-pay

## 2022-09-14 ENCOUNTER — Encounter (HOSPITAL_COMMUNITY): Payer: Self-pay | Admitting: *Deleted

## 2022-09-14 ENCOUNTER — Other Ambulatory Visit: Payer: Self-pay

## 2022-09-14 ENCOUNTER — Ambulatory Visit (HOSPITAL_COMMUNITY)
Admission: EM | Admit: 2022-09-14 | Discharge: 2022-09-14 | Disposition: A | Discharge status: Home / Self Care | Payer: PPO | Attending: Family Medicine | Admitting: Family Medicine

## 2022-09-14 DIAGNOSIS — S61012A Laceration without foreign body of left thumb without damage to nail, initial encounter: Secondary | ICD-10-CM

## 2022-09-14 MED ORDER — LIDOCAINE HCL (PF) 1 % IJ SOLN
INTRAMUSCULAR | Status: AC
  Filled 2022-09-14: qty 30

## 2022-09-14 NOTE — Discharge Instructions (Signed)
Clean wound 1-2 times daily and then put new antibiotic ointment on and a new  bandage.   Stitches need to come out in 5-7 days.  Return if looking infected.  Ice and elevation today may help how it feels

## 2022-09-14 NOTE — ED Provider Notes (Signed)
[facility]    CSN: [id number] Arrival date & time: 09/14/22  1701      History   Chief Complaint Chief Complaint  Patient presents with   Laceration    HPI Eddie Mullins is a 66 y.o. male.   HPI Here for a cut on his left thumb.  He is moving, and today he grabbed a block of knives and one came out and cut him on his thumb on the radial side.  It took him a minute to get the bleeding to stop and he put a pressure bandage on it.  He is not sure he is up-to-date on his Tdap, as he works as an Therapist, sports.  He is not on a blood thinner.  Past Medical History:  Diagnosis Date   Abnormal stress ECG with treadmill    Anginal pain (HCC)    Arthritis    Asthma    as a child   Bronchitis    Cancer ([place])    basal skin ca   Cirrhosis ([place])    Coronary artery disease    Diabetes mellitus type II    GERD (gastroesophageal reflux disease)    Hepatitis C    Hyperlipidemia    Hyperlipidemia LDL goal <70 10/02/2016   Hypertension    Research study patient 10/02/2016   S/P angioplasty with stent, DES, Synergy to LAD 10/01/16 10/02/2016    Patient Active Problem List   Diagnosis Date Noted   Essential hypertension 02/11/2020   Hepatitis C, chronic ([place]) 10/14/2016   S/P angioplasty with stent, DES, Synergy to LAD 10/01/16 10/02/2016   Insulin dependent diabetes mellitus 10/02/2016   Hyperlipidemia LDL goal <70 10/02/2016   Research study patient 10/02/2016   Abnormal stress ECG 10/01/2016   Angina pectoris ([place]) 10/01/2016   S/P lumbar microdiscectomy 08/14/2014   Hemorrhoids, internal, with bleeding 03/12/2013    Past Surgical History:  Procedure Laterality Date   ACROMIO-CLAVICULAR JOINT REPAIR     CARDIAC CATHETERIZATION  2005   clear results.   CARDIAC CATHETERIZATION N/A 10/01/2016   Procedure: Left Heart Cath and Coronary Angiography;  Surgeon: Belva Crome, MD;  Location: [facility] [facility];  Service: Cardiovascular;  Laterality: N/A;   CARDIAC  CATHETERIZATION N/A 10/01/2016   Procedure: Intravascular Pressure Wire/FFR Study;  Surgeon: Belva Crome, MD;  Location: [place] [facility];  Service: Cardiovascular;  Laterality: N/A;   CARDIAC CATHETERIZATION N/A 10/01/2016   Procedure: Coronary Stent Intervention;  Surgeon: Belva Crome, MD;  Location: [place] [facility];  Service: Cardiovascular;  Laterality: N/A;   CHOLECYSTECTOMY     COLONOSCOPY     CORONARY STENT PLACEMENT  10/01/2016   Successful PTCA and stenting of the ostial to proximal LAD   JOINT REPLACEMENT     KNEE SURGERY     LUMBAR LAMINECTOMY/DECOMPRESSION MICRODISCECTOMY Right 08/14/2014   Procedure: LUMBAR LAMINECTOMY/DECOMPRESSION MICRODISCECTOMY LUMBAR TWO-THREE;  Surgeon: Eustace Moore, MD;  Location: [facility];  Service: Neurosurgery;  Laterality: Right;   ROTATOR CUFF REPAIR     TONSILLECTOMY         Home Medications    Prior to Admission medications   Medication Sig Start Date End Date Taking? Authorizing Provider  acetaminophen (TYLENOL) 325 MG tablet Take 2 tablets (650 mg total) by mouth every 4 (four) hours as needed for headache or mild pain. 10/02/16   Isaiah Serge, NP  amLODipine (NORVASC) 5 MG tablet Take 5 mg by mouth daily.  01/25/13  [provider]  amLODipine (NORVASC) 5 MG tablet TAKE 1 TABLET BY MOUTH ONCE A DAY 12/26/20 12/26/21  Josetta Huddle, MD  amLODipine (NORVASC) 5 MG tablet TAKE 1 TABLET BY MOUTH ONCE A DAY 12/26/20 12/26/21  Josetta Huddle, MD  amLODipine (NORVASC) 5 MG tablet Take 1 tablet by mouth once a day 04/01/22     amLODipine (NORVASC) 5 MG tablet Take 1 tablet by mouth once a day 06/14/22     amoxicillin (AMOXIL) 500 MG capsule Take 1 capsule (500 mg total) by mouth every 8 (eight) hours until finished. 09/23/21     aspirin EC 81 MG tablet Take 1 tablet (81 mg total) by mouth daily. 01/04/18   Lelon Perla, MD  atorvastatin (LIPITOR) 80 MG tablet TAKE 1 TABLET BY MOUTH DAILY AT 6 PM. NEED OV. 12/01/18   Lelon Perla, MD  Continuous Blood Gluc Receiver (FREESTYLE LIBRE 2 READER) DEVI Use as directed 06/15/21     Continuous Blood Gluc Sensor (FREESTYLE LIBRE 2 SENSOR) MISC Apply to upper back of arm change every 14 days 06/15/21     hydrochlorothiazide (HYDRODIURIL) 25 MG tablet TAKE 1 TABLET BY MOUTH EVERY MORNING 12/26/20 12/26/21  Josetta Huddle, MD  hydrochlorothiazide (HYDRODIURIL) 25 MG tablet TAKE 1 TABLET BY MOUTH EVERY MORNING 12/26/20 12/26/21  Josetta Huddle, MD  hydrochlorothiazide (HYDRODIURIL) 25 MG tablet Take 1 tablet by mouth every morning for 90 days. 12/30/21     hydrochlorothiazide (HYDRODIURIL) 25 MG tablet Take 1 tablet by mouth in the morning 06/14/22     insulin aspart (NOVOLOG) 100 UNIT/ML injection INJECT UNDER THE SKIN WITH MEALS AS DIRECTED 08/08/20 08/08/21  Josetta Huddle, MD  insulin aspart (NOVOLOG) 100 UNIT/ML injection Inject 5-15 units as directed before meals. 11/24/21     insulin aspart (NOVOLOG) 100 UNIT/ML injection Use as directed into the skin  with meals 90 days 06/14/22     insulin glargine (LANTUS SOLOSTAR) 100 UNIT/ML Solostar Pen Inject 60 units into the skin once a day 02/24/22     insulin glargine (LANTUS) 100 UNIT/ML Solostar Pen INJECT 60 UNITS UNDER THE SKIN ONCE A DAY 01/26/21 01/26/22  Josetta Huddle, MD  insulin glargine (LANTUS) 100 unit/mL SOPN Inject 40 Units into the skin at bedtime.     [provider]  Insulin Pen Needle 31G X 8 MM MISC Use as directed once daily 04/02/22     Insulin Syringe-Needle U-100 31G X 5/16" 0.5 ML MISC Use 1 to 4 syringes subcutaneously daily as directed 06/16/21     Krill Oil 300 MG CAPS Take 900 mg by mouth daily.    [provider]  losartan (COZAAR) 100 MG tablet TAKE 1 TABLET BY MOUTH ONCE A DAY 12/26/20 12/26/21  Josetta Huddle, MD  losartan (COZAAR) 100 MG tablet TAKE 1 TABLET BY MOUTH ONCE A DAY 12/26/20 12/26/21  Josetta Huddle, MD  losartan (COZAAR) 100 MG tablet Take 1 tablet by mouth once a day 04/01/22     losartan (COZAAR) 100 MG  tablet Take 1 tablet by mouth once a day 06/14/22     losartan-hydrochlorothiazide (HYZAAR) 100-25 MG per tablet Take 1 tablet by mouth daily.     [provider]  meloxicam (MOBIC) 7.5 MG tablet Take 1 tablet by mouth once a day 05/25/21     methylphenidate (RITALIN) 20 MG tablet Take 20 mg by mouth daily as needed (for ADD).  01/10/12   Arfeen, Arlyce Harman, MD  methylphenidate (RITALIN) 20 MG tablet TAKE  1 TABLET BY MOUTH 2 TIMES DAILY. (FILL AFTER 01/03/21) 12/04/20 06/02/21  Josetta Huddle, MD  methylphenidate (RITALIN) 20 MG tablet TAKE 1 TABLET BY MOUTH 2 TIMES DAILY 12/04/20 06/02/21  Josetta Huddle, MD  methylphenidate (RITALIN) 20 MG tablet TAKE 1 TABLET BY MOUTH TWICE DAILY 10/30/20 04/28/21  Josetta Huddle, MD  methylphenidate (RITALIN) 20 MG tablet Take 1 tablet by mouth 2 times daily. 06/07/22     methylphenidate (RITALIN) 20 MG tablet Take 1 tablet by mouth Twice a day 30 days 08/26/22     methylPREDNISolone (MEDROL DOSEPAK) 4 MG TBPK tablet Take 6 tablets by mouth on day 1 and decrease by 1 tablet every day 06/15/21     metoprolol succinate (TOPROL-XL) 25 MG 24 hr tablet Take 1 tablet (25 mg total) by mouth daily. Pt must call and make appt for further refills. Thanks 03/19/19   Lelon Perla, MD  metoprolol succinate (TOPROL-XL) 25 MG 24 hr tablet Take 1 tablet by mouth once a day 04/01/22     metoprolol succinate (TOPROL-XL) 25 MG 24 hr tablet Take 1 tablet by mouth daily 06/14/22     Multiple Vitamins-Minerals (MULTIVITAMIN WITH MINERALS) tablet Take 1 tablet by mouth daily.    [provider]  nitroGLYCERIN (NITROSTAT) 0.4 MG SL tablet Place 1 tablet (0.4 mg total) under the tongue every 5 (five) minutes as needed for chest pain. Patient not taking: Reported on 02/11/2020 10/02/16   Isaiah Serge, NP  nitroGLYCERIN (NITROSTAT) 0.4 MG SL tablet Place 1 tablet by mouth under the tongue as directed as needed for chest pain 06/14/22     omeprazole (PRILOSEC) 20 MG capsule Take 20 mg  by mouth daily.    [provider]  omeprazole (PRILOSEC) 20 MG capsule TAKE 1 CAPSULE BY MOUTH TWO TIMES DAILY 30 MINUTES BEFORE A MEAL 12/26/20 12/26/21  Josetta Huddle, MD  omeprazole (PRILOSEC) 20 MG capsule TAKE 1 CAPSULE BY MOUTH 2 TIMES DAILY 30 MINUTES BEFORE A MEAL 12/26/20 03/26/22  Josetta Huddle, MD  omeprazole (PRILOSEC) 20 MG capsule Take 1 capsule by mouth 2 times a day  30 minutes before meal 07/05/22     tadalafil (CIALIS) 20 MG tablet Take 1 tablet by mouth once a day if needed 05/06/22     tadalafil (CIALIS) 20 MG tablet Take 1 tablet by mouth once a day if needed 07/05/22     tobramycin (TOBREX) 0.3 % ophthalmic solution Place one drop into both eyes every 4 (four) hours for 7 days. 06/10/22       Family History Family History  Problem Relation Age of Onset   Hypertension Mother     Social History Social History   Tobacco Use   Smoking status: Never   Smokeless tobacco: Former    Quit date: 03/13/1999  Substance Use Topics   Alcohol use: Yes    Comment: Occasional   Drug use: No     Allergies   Patient has no known allergies.   Review of Systems Review of Systems   Physical Exam Triage Vital Signs ED Triage Vitals [09/14/22 1724]  Enc Vitals Group     BP (!) 158/84     Pulse Rate 81     Resp 18     Temp 98.7 F (37.1 C)     Temp src      SpO2 98 %     Weight      Height      Head Circumference  Peak Flow      Pain Score      Pain Loc      Pain Edu?      Excl. in [place]?    No data found.  Updated Vital Signs BP (!) 158/84   Pulse 81   Temp 98.7 F (37.1 C)   Resp 18   SpO2 98%   Visual Acuity Right Eye Distance:   Left Eye Distance:   Bilateral Distance:    Right Eye Near:   Left Eye Near:    Bilateral Near:     Physical Exam Vitals reviewed.  Constitutional:      General: He is not in acute distress.    Appearance: He is not toxic-appearing.  Skin:    Comments: There is a laceration on his thumb in an L shape.  In total  it is about 2 cm in length.  It is not bleeding when we get the pressure bandage off.  Neurological:     General: No focal deficit present.     Mental Status: He is alert and oriented to person, place, and time.  Psychiatric:        Behavior: Behavior normal.      UC Treatments / Results  Labs (all labs ordered are listed, but only abnormal results are displayed) Labs Reviewed - No data to display  EKG   Radiology No results found.  Procedures Procedures (including critical care time)  Medications Ordered in UC Medications - No data to display  Initial Impression / Assessment and Plan / UC Course  I have reviewed the triage vital signs and the nursing notes.  Pertinent labs & imaging results that were available during my care of the patient were reviewed by me and considered in my medical decision making (see chart for details).        After verbal consent is given lidocaine plain is used to accomplish infiltrative anesthesia at the laceration site.  Under clean conditions, 5-0 nylon was used to approximate the edges of the laceration.  3 sutures were placed on the longer portion and one suture was placed on the bottom part of the L portion.  That is to say 4 sutures were placed in total.  No complications and EBL is never legible.  Bandages applied and wound care is explained.  Sutures need to be removed in 5 to 7 days. Final Clinical Impressions(s) / UC Diagnoses   Final diagnoses:  Laceration of left thumb without foreign body without damage to nail, initial encounter     Discharge Instructions      Clean wound 1-2 times daily and then put new antibiotic ointment on and a new  bandage.   Stitches need to come out in 5-7 days.  Return if looking infected.  Ice and elevation today may help how it feels     ED Prescriptions   None    PDMP not reviewed this encounter.   Barrett Henle, MD 09/14/22 2056

## 2022-09-14 NOTE — ED Triage Notes (Signed)
Pt reports he cut his Lt thumb on a knife this morning. Pt may need a tetanus shot but not sure.

## 2022-09-15 ENCOUNTER — Other Ambulatory Visit (HOSPITAL_COMMUNITY): Payer: Self-pay

## 2022-09-27 ENCOUNTER — Other Ambulatory Visit (HOSPITAL_COMMUNITY): Payer: Self-pay

## 2022-09-28 ENCOUNTER — Other Ambulatory Visit (HOSPITAL_COMMUNITY): Payer: Self-pay

## 2022-09-29 ENCOUNTER — Other Ambulatory Visit (HOSPITAL_COMMUNITY): Payer: Self-pay

## 2022-10-04 ENCOUNTER — Other Ambulatory Visit (HOSPITAL_COMMUNITY): Payer: Self-pay

## 2022-10-08 ENCOUNTER — Other Ambulatory Visit (HOSPITAL_COMMUNITY): Payer: Self-pay

## 2022-10-09 ENCOUNTER — Other Ambulatory Visit (HOSPITAL_COMMUNITY): Payer: Self-pay

## 2022-10-11 ENCOUNTER — Other Ambulatory Visit (HOSPITAL_COMMUNITY): Payer: Self-pay

## 2022-10-16 ENCOUNTER — Other Ambulatory Visit (HOSPITAL_COMMUNITY): Payer: Self-pay

## 2022-11-12 ENCOUNTER — Other Ambulatory Visit (HOSPITAL_COMMUNITY): Payer: Self-pay

## 2022-11-13 ENCOUNTER — Other Ambulatory Visit (HOSPITAL_COMMUNITY): Payer: Self-pay

## 2022-11-15 ENCOUNTER — Other Ambulatory Visit (HOSPITAL_COMMUNITY): Payer: Self-pay

## 2022-11-16 ENCOUNTER — Other Ambulatory Visit (HOSPITAL_COMMUNITY): Payer: Self-pay

## 2022-12-12 ENCOUNTER — Other Ambulatory Visit (HOSPITAL_COMMUNITY): Payer: Self-pay

## 2022-12-14 ENCOUNTER — Other Ambulatory Visit (HOSPITAL_COMMUNITY): Payer: Self-pay

## 2022-12-14 ENCOUNTER — Other Ambulatory Visit: Payer: Self-pay

## 2022-12-15 ENCOUNTER — Other Ambulatory Visit: Payer: Self-pay

## 2022-12-15 ENCOUNTER — Other Ambulatory Visit (HOSPITAL_COMMUNITY): Payer: Self-pay

## 2022-12-16 ENCOUNTER — Other Ambulatory Visit (HOSPITAL_COMMUNITY): Payer: Self-pay

## 2023-01-20 ENCOUNTER — Other Ambulatory Visit (HOSPITAL_COMMUNITY): Payer: Self-pay

## 2023-01-20 ENCOUNTER — Other Ambulatory Visit: Payer: Self-pay

## 2023-01-20 ENCOUNTER — Encounter (HOSPITAL_COMMUNITY): Payer: Self-pay

## 2023-01-24 ENCOUNTER — Other Ambulatory Visit: Payer: Self-pay

## 2023-01-25 ENCOUNTER — Other Ambulatory Visit (HOSPITAL_COMMUNITY): Payer: Self-pay

## 2023-02-08 ENCOUNTER — Other Ambulatory Visit (HOSPITAL_COMMUNITY): Payer: Self-pay

## 2023-12-27 ENCOUNTER — Other Ambulatory Visit (HOSPITAL_COMMUNITY): Payer: Self-pay

## 2023-12-27 ENCOUNTER — Other Ambulatory Visit: Payer: Self-pay

## 2023-12-27 MED ORDER — HYDROCODONE-ACETAMINOPHEN 5-325 MG PO TABS
1.0000 | ORAL_TABLET | ORAL | 0 refills | Status: AC | PRN
  Filled 2023-12-27: qty 30, 5d supply, fill #0

## 2024-01-16 ENCOUNTER — Encounter (HOSPITAL_COMMUNITY): Payer: Self-pay

## 2024-01-16 ENCOUNTER — Other Ambulatory Visit (HOSPITAL_COMMUNITY): Payer: Self-pay

## 2024-01-16 MED ORDER — NALOXONE HCL 4 MG/0.1ML NA LIQD
1.0000 | Freq: Once | NASAL | 0 refills | Status: AC | PRN
  Filled 2024-01-16: qty 2, 30d supply, fill #0

## 2024-01-16 MED ORDER — OXYCODONE-ACETAMINOPHEN 10-325 MG PO TABS
1.0000 | ORAL_TABLET | ORAL | 0 refills | Status: AC | PRN
  Filled 2024-01-16: qty 20, 2d supply, fill #0

## 2024-01-17 ENCOUNTER — Other Ambulatory Visit (HOSPITAL_COMMUNITY): Payer: Self-pay

## 2024-01-17 ENCOUNTER — Other Ambulatory Visit: Payer: Self-pay

## 2024-01-18 ENCOUNTER — Other Ambulatory Visit: Payer: Self-pay

## 2024-01-18 ENCOUNTER — Other Ambulatory Visit (HOSPITAL_COMMUNITY): Payer: Self-pay

## 2024-01-19 ENCOUNTER — Other Ambulatory Visit: Payer: Self-pay

## 2024-01-19 ENCOUNTER — Other Ambulatory Visit (HOSPITAL_COMMUNITY): Payer: Self-pay

## 2024-01-20 ENCOUNTER — Other Ambulatory Visit: Payer: Self-pay

## 2024-01-20 ENCOUNTER — Other Ambulatory Visit (HOSPITAL_BASED_OUTPATIENT_CLINIC_OR_DEPARTMENT_OTHER): Payer: Self-pay
